# Patient Record
Sex: Female | Born: 1945 | Race: Black or African American | Hispanic: No | Smoking: Former smoker
Health system: Southern US, Community
[De-identification: ages and names within clinical notes are randomized; demographics above are authoritative.]

## PROBLEM LIST (undated history)

## (undated) DIAGNOSIS — K219 Gastro-esophageal reflux disease without esophagitis: Secondary | ICD-10-CM

## (undated) DIAGNOSIS — I4891 Unspecified atrial fibrillation: Secondary | ICD-10-CM

## (undated) DIAGNOSIS — R1310 Dysphagia, unspecified: Secondary | ICD-10-CM

## (undated) DIAGNOSIS — J45909 Unspecified asthma, uncomplicated: Secondary | ICD-10-CM

## (undated) DIAGNOSIS — D649 Anemia, unspecified: Secondary | ICD-10-CM

## (undated) DIAGNOSIS — I639 Cerebral infarction, unspecified: Secondary | ICD-10-CM

## (undated) DIAGNOSIS — I10 Essential (primary) hypertension: Secondary | ICD-10-CM

## (undated) DIAGNOSIS — E119 Type 2 diabetes mellitus without complications: Secondary | ICD-10-CM

## (undated) DIAGNOSIS — N189 Chronic kidney disease, unspecified: Secondary | ICD-10-CM

## (undated) HISTORY — PX: GASTROSTOMY TUBE CHANGE: SHX312

## (undated) HISTORY — PX: JOINT REPLACEMENT: SHX530

## (undated) HISTORY — PX: COLONOSCOPY: SHX174

## (undated) HISTORY — PX: EYE SURGERY: SHX253

---

## 2015-09-11 DIAGNOSIS — E119 Type 2 diabetes mellitus without complications: Secondary | ICD-10-CM | POA: Diagnosis not present

## 2015-09-11 DIAGNOSIS — I10 Essential (primary) hypertension: Secondary | ICD-10-CM | POA: Diagnosis not present

## 2015-09-12 DIAGNOSIS — E1165 Type 2 diabetes mellitus with hyperglycemia: Secondary | ICD-10-CM | POA: Diagnosis not present

## 2015-09-12 DIAGNOSIS — N179 Acute kidney failure, unspecified: Secondary | ICD-10-CM | POA: Diagnosis not present

## 2015-09-12 DIAGNOSIS — E860 Dehydration: Secondary | ICD-10-CM | POA: Diagnosis not present

## 2015-09-12 DIAGNOSIS — Z0389 Encounter for observation for other suspected diseases and conditions ruled out: Secondary | ICD-10-CM | POA: Diagnosis not present

## 2015-09-12 DIAGNOSIS — I10 Essential (primary) hypertension: Secondary | ICD-10-CM | POA: Diagnosis not present

## 2015-09-18 DIAGNOSIS — I10 Essential (primary) hypertension: Secondary | ICD-10-CM | POA: Diagnosis not present

## 2015-09-25 DIAGNOSIS — I10 Essential (primary) hypertension: Secondary | ICD-10-CM | POA: Diagnosis not present

## 2015-12-11 DIAGNOSIS — I639 Cerebral infarction, unspecified: Secondary | ICD-10-CM

## 2015-12-11 HISTORY — DX: Cerebral infarction, unspecified: I63.9

## 2015-12-30 DIAGNOSIS — I63532 Cerebral infarction due to unspecified occlusion or stenosis of left posterior cerebral artery: Secondary | ICD-10-CM | POA: Diagnosis not present

## 2015-12-30 DIAGNOSIS — R4189 Other symptoms and signs involving cognitive functions and awareness: Secondary | ICD-10-CM | POA: Diagnosis present

## 2015-12-30 DIAGNOSIS — K219 Gastro-esophageal reflux disease without esophagitis: Secondary | ICD-10-CM | POA: Diagnosis not present

## 2015-12-30 DIAGNOSIS — I639 Cerebral infarction, unspecified: Secondary | ICD-10-CM | POA: Diagnosis not present

## 2015-12-30 DIAGNOSIS — E871 Hypo-osmolality and hyponatremia: Secondary | ICD-10-CM | POA: Diagnosis not present

## 2015-12-30 DIAGNOSIS — I1310 Hypertensive heart and chronic kidney disease without heart failure, with stage 1 through stage 4 chronic kidney disease, or unspecified chronic kidney disease: Secondary | ICD-10-CM | POA: Diagnosis present

## 2015-12-30 DIAGNOSIS — Z4659 Encounter for fitting and adjustment of other gastrointestinal appliance and device: Secondary | ICD-10-CM | POA: Diagnosis not present

## 2015-12-30 DIAGNOSIS — R2689 Other abnormalities of gait and mobility: Secondary | ICD-10-CM | POA: Diagnosis not present

## 2015-12-30 DIAGNOSIS — Z743 Need for continuous supervision: Secondary | ICD-10-CM | POA: Diagnosis not present

## 2015-12-30 DIAGNOSIS — Z431 Encounter for attention to gastrostomy: Secondary | ICD-10-CM | POA: Diagnosis not present

## 2015-12-30 DIAGNOSIS — E1121 Type 2 diabetes mellitus with diabetic nephropathy: Secondary | ICD-10-CM | POA: Diagnosis not present

## 2015-12-30 DIAGNOSIS — R262 Difficulty in walking, not elsewhere classified: Secondary | ICD-10-CM | POA: Diagnosis not present

## 2015-12-30 DIAGNOSIS — E559 Vitamin D deficiency, unspecified: Secondary | ICD-10-CM | POA: Diagnosis present

## 2015-12-30 DIAGNOSIS — R627 Adult failure to thrive: Secondary | ICD-10-CM | POA: Diagnosis not present

## 2015-12-30 DIAGNOSIS — N179 Acute kidney failure, unspecified: Secondary | ICD-10-CM | POA: Diagnosis not present

## 2015-12-30 DIAGNOSIS — D649 Anemia, unspecified: Secondary | ICD-10-CM | POA: Diagnosis not present

## 2015-12-30 DIAGNOSIS — A419 Sepsis, unspecified organism: Secondary | ICD-10-CM | POA: Diagnosis not present

## 2015-12-30 DIAGNOSIS — E119 Type 2 diabetes mellitus without complications: Secondary | ICD-10-CM | POA: Diagnosis not present

## 2015-12-30 DIAGNOSIS — R000 Tachycardia, unspecified: Secondary | ICD-10-CM | POA: Diagnosis not present

## 2015-12-30 DIAGNOSIS — F09 Unspecified mental disorder due to known physiological condition: Secondary | ICD-10-CM | POA: Diagnosis not present

## 2015-12-30 DIAGNOSIS — R1310 Dysphagia, unspecified: Secondary | ICD-10-CM | POA: Diagnosis not present

## 2015-12-30 DIAGNOSIS — E1122 Type 2 diabetes mellitus with diabetic chronic kidney disease: Secondary | ICD-10-CM | POA: Diagnosis not present

## 2015-12-30 DIAGNOSIS — I517 Cardiomegaly: Secondary | ICD-10-CM | POA: Diagnosis not present

## 2015-12-30 DIAGNOSIS — I10 Essential (primary) hypertension: Secondary | ICD-10-CM | POA: Diagnosis not present

## 2015-12-30 DIAGNOSIS — R402211 Coma scale, best verbal response, none, in the field [EMT or ambulance]: Secondary | ICD-10-CM | POA: Diagnosis present

## 2015-12-30 DIAGNOSIS — Z4682 Encounter for fitting and adjustment of non-vascular catheter: Secondary | ICD-10-CM | POA: Diagnosis not present

## 2015-12-30 DIAGNOSIS — R451 Restlessness and agitation: Secondary | ICD-10-CM | POA: Diagnosis present

## 2015-12-30 DIAGNOSIS — D72829 Elevated white blood cell count, unspecified: Secondary | ICD-10-CM | POA: Diagnosis not present

## 2015-12-30 DIAGNOSIS — N184 Chronic kidney disease, stage 4 (severe): Secondary | ICD-10-CM | POA: Diagnosis not present

## 2015-12-30 DIAGNOSIS — R402141 Coma scale, eyes open, spontaneous, in the field [EMT or ambulance]: Secondary | ICD-10-CM | POA: Diagnosis present

## 2015-12-30 DIAGNOSIS — I272 Pulmonary hypertension, unspecified: Secondary | ICD-10-CM | POA: Diagnosis present

## 2015-12-30 DIAGNOSIS — E1159 Type 2 diabetes mellitus with other circulatory complications: Secondary | ICD-10-CM | POA: Diagnosis not present

## 2015-12-30 DIAGNOSIS — I248 Other forms of acute ischemic heart disease: Secondary | ICD-10-CM | POA: Diagnosis present

## 2015-12-30 DIAGNOSIS — R4701 Aphasia: Secondary | ICD-10-CM | POA: Diagnosis not present

## 2015-12-30 DIAGNOSIS — R4182 Altered mental status, unspecified: Secondary | ICD-10-CM | POA: Diagnosis not present

## 2015-12-30 DIAGNOSIS — R1312 Dysphagia, oropharyngeal phase: Secondary | ICD-10-CM | POA: Diagnosis not present

## 2015-12-30 DIAGNOSIS — T17320A Food in larynx causing asphyxiation, initial encounter: Secondary | ICD-10-CM | POA: Diagnosis not present

## 2015-12-30 DIAGNOSIS — Z781 Physical restraint status: Secondary | ICD-10-CM | POA: Diagnosis not present

## 2015-12-30 DIAGNOSIS — E870 Hyperosmolality and hypernatremia: Secondary | ICD-10-CM | POA: Diagnosis present

## 2015-12-30 DIAGNOSIS — Z9049 Acquired absence of other specified parts of digestive tract: Secondary | ICD-10-CM | POA: Diagnosis not present

## 2015-12-30 DIAGNOSIS — E872 Acidosis: Secondary | ICD-10-CM | POA: Diagnosis not present

## 2015-12-30 DIAGNOSIS — D638 Anemia in other chronic diseases classified elsewhere: Secondary | ICD-10-CM | POA: Diagnosis present

## 2015-12-30 DIAGNOSIS — R402361 Coma scale, best motor response, obeys commands, in the field [EMT or ambulance]: Secondary | ICD-10-CM | POA: Diagnosis present

## 2015-12-30 DIAGNOSIS — I4891 Unspecified atrial fibrillation: Secondary | ICD-10-CM | POA: Diagnosis not present

## 2015-12-30 DIAGNOSIS — E785 Hyperlipidemia, unspecified: Secondary | ICD-10-CM | POA: Diagnosis present

## 2015-12-30 DIAGNOSIS — I4510 Unspecified right bundle-branch block: Secondary | ICD-10-CM | POA: Diagnosis present

## 2015-12-30 DIAGNOSIS — M6281 Muscle weakness (generalized): Secondary | ICD-10-CM | POA: Diagnosis not present

## 2015-12-30 DIAGNOSIS — I619 Nontraumatic intracerebral hemorrhage, unspecified: Secondary | ICD-10-CM | POA: Diagnosis not present

## 2015-12-30 DIAGNOSIS — I499 Cardiac arrhythmia, unspecified: Secondary | ICD-10-CM | POA: Diagnosis not present

## 2015-12-30 DIAGNOSIS — I63432 Cerebral infarction due to embolism of left posterior cerebral artery: Secondary | ICD-10-CM | POA: Diagnosis present

## 2015-12-30 DIAGNOSIS — I6789 Other cerebrovascular disease: Secondary | ICD-10-CM | POA: Diagnosis not present

## 2015-12-30 DIAGNOSIS — N189 Chronic kidney disease, unspecified: Secondary | ICD-10-CM | POA: Diagnosis not present

## 2015-12-30 DIAGNOSIS — Z8679 Personal history of other diseases of the circulatory system: Secondary | ICD-10-CM | POA: Diagnosis not present

## 2015-12-30 DIAGNOSIS — R509 Fever, unspecified: Secondary | ICD-10-CM | POA: Diagnosis not present

## 2015-12-30 DIAGNOSIS — G9340 Encephalopathy, unspecified: Secondary | ICD-10-CM | POA: Diagnosis present

## 2015-12-30 DIAGNOSIS — R001 Bradycardia, unspecified: Secondary | ICD-10-CM | POA: Diagnosis not present

## 2015-12-30 DIAGNOSIS — J9690 Respiratory failure, unspecified, unspecified whether with hypoxia or hypercapnia: Secondary | ICD-10-CM | POA: Diagnosis not present

## 2015-12-30 DIAGNOSIS — G8191 Hemiplegia, unspecified affecting right dominant side: Secondary | ICD-10-CM | POA: Diagnosis present

## 2015-12-30 DIAGNOSIS — I69991 Dysphagia following unspecified cerebrovascular disease: Secondary | ICD-10-CM | POA: Diagnosis not present

## 2015-12-30 DIAGNOSIS — R0989 Other specified symptoms and signs involving the circulatory and respiratory systems: Secondary | ICD-10-CM | POA: Diagnosis not present

## 2015-12-30 DIAGNOSIS — R109 Unspecified abdominal pain: Secondary | ICD-10-CM | POA: Diagnosis not present

## 2015-12-30 DIAGNOSIS — J811 Chronic pulmonary edema: Secondary | ICD-10-CM | POA: Diagnosis not present

## 2016-01-12 DIAGNOSIS — I69991 Dysphagia following unspecified cerebrovascular disease: Secondary | ICD-10-CM | POA: Diagnosis not present

## 2016-01-23 DIAGNOSIS — K219 Gastro-esophageal reflux disease without esophagitis: Secondary | ICD-10-CM | POA: Diagnosis not present

## 2016-01-23 DIAGNOSIS — R262 Difficulty in walking, not elsewhere classified: Secondary | ICD-10-CM | POA: Diagnosis not present

## 2016-01-23 DIAGNOSIS — N189 Chronic kidney disease, unspecified: Secondary | ICD-10-CM | POA: Diagnosis not present

## 2016-01-23 DIAGNOSIS — D649 Anemia, unspecified: Secondary | ICD-10-CM | POA: Diagnosis not present

## 2016-01-23 DIAGNOSIS — Z431 Encounter for attention to gastrostomy: Secondary | ICD-10-CM | POA: Diagnosis not present

## 2016-01-23 DIAGNOSIS — N179 Acute kidney failure, unspecified: Secondary | ICD-10-CM | POA: Diagnosis not present

## 2016-01-23 DIAGNOSIS — I739 Peripheral vascular disease, unspecified: Secondary | ICD-10-CM | POA: Diagnosis not present

## 2016-01-23 DIAGNOSIS — I63532 Cerebral infarction due to unspecified occlusion or stenosis of left posterior cerebral artery: Secondary | ICD-10-CM | POA: Diagnosis not present

## 2016-01-23 DIAGNOSIS — E1140 Type 2 diabetes mellitus with diabetic neuropathy, unspecified: Secondary | ICD-10-CM | POA: Diagnosis not present

## 2016-01-23 DIAGNOSIS — R1310 Dysphagia, unspecified: Secondary | ICD-10-CM | POA: Diagnosis not present

## 2016-01-23 DIAGNOSIS — Z743 Need for continuous supervision: Secondary | ICD-10-CM | POA: Diagnosis not present

## 2016-01-23 DIAGNOSIS — R001 Bradycardia, unspecified: Secondary | ICD-10-CM | POA: Diagnosis not present

## 2016-01-23 DIAGNOSIS — M6281 Muscle weakness (generalized): Secondary | ICD-10-CM | POA: Diagnosis not present

## 2016-01-23 DIAGNOSIS — L84 Corns and callosities: Secondary | ICD-10-CM | POA: Diagnosis not present

## 2016-01-23 DIAGNOSIS — E559 Vitamin D deficiency, unspecified: Secondary | ICD-10-CM | POA: Diagnosis not present

## 2016-01-23 DIAGNOSIS — I4891 Unspecified atrial fibrillation: Secondary | ICD-10-CM | POA: Diagnosis not present

## 2016-01-23 DIAGNOSIS — I63422 Cerebral infarction due to embolism of left anterior cerebral artery: Secondary | ICD-10-CM | POA: Diagnosis not present

## 2016-01-23 DIAGNOSIS — R509 Fever, unspecified: Secondary | ICD-10-CM | POA: Diagnosis not present

## 2016-01-23 DIAGNOSIS — E119 Type 2 diabetes mellitus without complications: Secondary | ICD-10-CM | POA: Diagnosis not present

## 2016-01-23 DIAGNOSIS — R1312 Dysphagia, oropharyngeal phase: Secondary | ICD-10-CM | POA: Diagnosis not present

## 2016-01-23 DIAGNOSIS — I10 Essential (primary) hypertension: Secondary | ICD-10-CM | POA: Diagnosis not present

## 2016-01-23 DIAGNOSIS — E785 Hyperlipidemia, unspecified: Secondary | ICD-10-CM | POA: Diagnosis not present

## 2016-01-23 DIAGNOSIS — I6789 Other cerebrovascular disease: Secondary | ICD-10-CM | POA: Diagnosis not present

## 2016-01-23 DIAGNOSIS — B351 Tinea unguium: Secondary | ICD-10-CM | POA: Diagnosis not present

## 2016-01-23 DIAGNOSIS — I639 Cerebral infarction, unspecified: Secondary | ICD-10-CM | POA: Diagnosis not present

## 2016-01-23 DIAGNOSIS — F09 Unspecified mental disorder due to known physiological condition: Secondary | ICD-10-CM | POA: Diagnosis not present

## 2016-01-23 DIAGNOSIS — R2689 Other abnormalities of gait and mobility: Secondary | ICD-10-CM | POA: Diagnosis not present

## 2016-01-26 DIAGNOSIS — I63422 Cerebral infarction due to embolism of left anterior cerebral artery: Secondary | ICD-10-CM | POA: Diagnosis not present

## 2016-01-26 DIAGNOSIS — R1310 Dysphagia, unspecified: Secondary | ICD-10-CM | POA: Diagnosis not present

## 2016-01-26 DIAGNOSIS — I10 Essential (primary) hypertension: Secondary | ICD-10-CM | POA: Diagnosis not present

## 2016-02-11 DIAGNOSIS — E559 Vitamin D deficiency, unspecified: Secondary | ICD-10-CM | POA: Diagnosis not present

## 2016-02-11 DIAGNOSIS — I10 Essential (primary) hypertension: Secondary | ICD-10-CM | POA: Diagnosis not present

## 2016-02-11 DIAGNOSIS — I63422 Cerebral infarction due to embolism of left anterior cerebral artery: Secondary | ICD-10-CM | POA: Diagnosis not present

## 2016-02-11 DIAGNOSIS — R1310 Dysphagia, unspecified: Secondary | ICD-10-CM | POA: Diagnosis not present

## 2016-02-12 DIAGNOSIS — E1140 Type 2 diabetes mellitus with diabetic neuropathy, unspecified: Secondary | ICD-10-CM | POA: Diagnosis not present

## 2016-02-12 DIAGNOSIS — L84 Corns and callosities: Secondary | ICD-10-CM | POA: Diagnosis not present

## 2016-02-12 DIAGNOSIS — B351 Tinea unguium: Secondary | ICD-10-CM | POA: Diagnosis not present

## 2016-02-12 DIAGNOSIS — I739 Peripheral vascular disease, unspecified: Secondary | ICD-10-CM | POA: Diagnosis not present

## 2016-03-01 DIAGNOSIS — I4891 Unspecified atrial fibrillation: Secondary | ICD-10-CM | POA: Diagnosis not present

## 2016-03-01 DIAGNOSIS — I10 Essential (primary) hypertension: Secondary | ICD-10-CM | POA: Diagnosis not present

## 2016-03-01 DIAGNOSIS — I639 Cerebral infarction, unspecified: Secondary | ICD-10-CM | POA: Diagnosis not present

## 2016-03-01 DIAGNOSIS — E119 Type 2 diabetes mellitus without complications: Secondary | ICD-10-CM | POA: Diagnosis not present

## 2016-03-15 DIAGNOSIS — E559 Vitamin D deficiency, unspecified: Secondary | ICD-10-CM | POA: Diagnosis not present

## 2016-03-15 DIAGNOSIS — I63422 Cerebral infarction due to embolism of left anterior cerebral artery: Secondary | ICD-10-CM | POA: Diagnosis not present

## 2016-03-15 DIAGNOSIS — I10 Essential (primary) hypertension: Secondary | ICD-10-CM | POA: Diagnosis not present

## 2016-03-15 DIAGNOSIS — R1310 Dysphagia, unspecified: Secondary | ICD-10-CM | POA: Diagnosis not present

## 2016-03-17 DIAGNOSIS — I639 Cerebral infarction, unspecified: Secondary | ICD-10-CM | POA: Diagnosis not present

## 2016-03-17 DIAGNOSIS — E785 Hyperlipidemia, unspecified: Secondary | ICD-10-CM | POA: Insufficient documentation

## 2016-03-17 DIAGNOSIS — Z8673 Personal history of transient ischemic attack (TIA), and cerebral infarction without residual deficits: Secondary | ICD-10-CM | POA: Diagnosis not present

## 2016-03-17 DIAGNOSIS — D649 Anemia, unspecified: Secondary | ICD-10-CM | POA: Insufficient documentation

## 2016-03-17 DIAGNOSIS — R1312 Dysphagia, oropharyngeal phase: Secondary | ICD-10-CM | POA: Diagnosis not present

## 2016-03-17 DIAGNOSIS — E119 Type 2 diabetes mellitus without complications: Secondary | ICD-10-CM | POA: Insufficient documentation

## 2016-03-17 DIAGNOSIS — I10 Essential (primary) hypertension: Secondary | ICD-10-CM | POA: Insufficient documentation

## 2016-03-17 DIAGNOSIS — N189 Chronic kidney disease, unspecified: Secondary | ICD-10-CM | POA: Diagnosis not present

## 2016-03-17 DIAGNOSIS — I129 Hypertensive chronic kidney disease with stage 1 through stage 4 chronic kidney disease, or unspecified chronic kidney disease: Secondary | ICD-10-CM | POA: Diagnosis not present

## 2016-03-17 DIAGNOSIS — I4891 Unspecified atrial fibrillation: Secondary | ICD-10-CM | POA: Insufficient documentation

## 2016-03-17 DIAGNOSIS — Z7901 Long term (current) use of anticoagulants: Secondary | ICD-10-CM | POA: Diagnosis not present

## 2016-03-17 DIAGNOSIS — K219 Gastro-esophageal reflux disease without esophagitis: Secondary | ICD-10-CM | POA: Insufficient documentation

## 2016-03-17 DIAGNOSIS — M6281 Muscle weakness (generalized): Secondary | ICD-10-CM | POA: Insufficient documentation

## 2016-03-18 DIAGNOSIS — R1312 Dysphagia, oropharyngeal phase: Secondary | ICD-10-CM | POA: Diagnosis not present

## 2016-03-18 DIAGNOSIS — E1122 Type 2 diabetes mellitus with diabetic chronic kidney disease: Secondary | ICD-10-CM | POA: Diagnosis not present

## 2016-03-18 DIAGNOSIS — E1165 Type 2 diabetes mellitus with hyperglycemia: Secondary | ICD-10-CM | POA: Diagnosis not present

## 2016-03-18 DIAGNOSIS — B353 Tinea pedis: Secondary | ICD-10-CM | POA: Diagnosis not present

## 2016-03-18 DIAGNOSIS — D649 Anemia, unspecified: Secondary | ICD-10-CM | POA: Diagnosis not present

## 2016-03-18 DIAGNOSIS — E785 Hyperlipidemia, unspecified: Secondary | ICD-10-CM | POA: Diagnosis not present

## 2016-03-18 DIAGNOSIS — I4891 Unspecified atrial fibrillation: Secondary | ICD-10-CM | POA: Diagnosis not present

## 2016-03-18 DIAGNOSIS — I10 Essential (primary) hypertension: Secondary | ICD-10-CM | POA: Diagnosis not present

## 2016-03-18 DIAGNOSIS — N189 Chronic kidney disease, unspecified: Secondary | ICD-10-CM | POA: Diagnosis not present

## 2016-03-18 DIAGNOSIS — I639 Cerebral infarction, unspecified: Secondary | ICD-10-CM | POA: Diagnosis not present

## 2016-03-21 DIAGNOSIS — Z8673 Personal history of transient ischemic attack (TIA), and cerebral infarction without residual deficits: Secondary | ICD-10-CM | POA: Diagnosis not present

## 2016-03-21 DIAGNOSIS — E785 Hyperlipidemia, unspecified: Secondary | ICD-10-CM | POA: Diagnosis not present

## 2016-03-21 DIAGNOSIS — N189 Chronic kidney disease, unspecified: Secondary | ICD-10-CM | POA: Diagnosis not present

## 2016-03-21 DIAGNOSIS — I4891 Unspecified atrial fibrillation: Secondary | ICD-10-CM | POA: Diagnosis not present

## 2016-03-21 DIAGNOSIS — E119 Type 2 diabetes mellitus without complications: Secondary | ICD-10-CM | POA: Diagnosis not present

## 2016-03-21 DIAGNOSIS — I129 Hypertensive chronic kidney disease with stage 1 through stage 4 chronic kidney disease, or unspecified chronic kidney disease: Secondary | ICD-10-CM | POA: Diagnosis not present

## 2016-03-21 DIAGNOSIS — I10 Essential (primary) hypertension: Secondary | ICD-10-CM | POA: Diagnosis not present

## 2016-03-22 DIAGNOSIS — I4891 Unspecified atrial fibrillation: Secondary | ICD-10-CM | POA: Diagnosis not present

## 2016-03-22 DIAGNOSIS — Z8673 Personal history of transient ischemic attack (TIA), and cerebral infarction without residual deficits: Secondary | ICD-10-CM | POA: Diagnosis not present

## 2016-03-22 DIAGNOSIS — I129 Hypertensive chronic kidney disease with stage 1 through stage 4 chronic kidney disease, or unspecified chronic kidney disease: Secondary | ICD-10-CM | POA: Diagnosis not present

## 2016-03-22 DIAGNOSIS — E119 Type 2 diabetes mellitus without complications: Secondary | ICD-10-CM | POA: Diagnosis not present

## 2016-03-22 DIAGNOSIS — E785 Hyperlipidemia, unspecified: Secondary | ICD-10-CM | POA: Diagnosis not present

## 2016-03-22 DIAGNOSIS — N189 Chronic kidney disease, unspecified: Secondary | ICD-10-CM | POA: Diagnosis not present

## 2016-03-23 DIAGNOSIS — Z8673 Personal history of transient ischemic attack (TIA), and cerebral infarction without residual deficits: Secondary | ICD-10-CM | POA: Diagnosis not present

## 2016-03-23 DIAGNOSIS — N189 Chronic kidney disease, unspecified: Secondary | ICD-10-CM | POA: Diagnosis not present

## 2016-03-23 DIAGNOSIS — E785 Hyperlipidemia, unspecified: Secondary | ICD-10-CM | POA: Diagnosis not present

## 2016-03-23 DIAGNOSIS — I129 Hypertensive chronic kidney disease with stage 1 through stage 4 chronic kidney disease, or unspecified chronic kidney disease: Secondary | ICD-10-CM | POA: Diagnosis not present

## 2016-03-23 DIAGNOSIS — I4891 Unspecified atrial fibrillation: Secondary | ICD-10-CM | POA: Diagnosis not present

## 2016-03-23 DIAGNOSIS — E119 Type 2 diabetes mellitus without complications: Secondary | ICD-10-CM | POA: Diagnosis not present

## 2016-03-24 DIAGNOSIS — I4891 Unspecified atrial fibrillation: Secondary | ICD-10-CM | POA: Diagnosis not present

## 2016-03-24 DIAGNOSIS — I129 Hypertensive chronic kidney disease with stage 1 through stage 4 chronic kidney disease, or unspecified chronic kidney disease: Secondary | ICD-10-CM | POA: Diagnosis not present

## 2016-03-24 DIAGNOSIS — N189 Chronic kidney disease, unspecified: Secondary | ICD-10-CM | POA: Diagnosis not present

## 2016-03-24 DIAGNOSIS — E785 Hyperlipidemia, unspecified: Secondary | ICD-10-CM | POA: Diagnosis not present

## 2016-03-24 DIAGNOSIS — E119 Type 2 diabetes mellitus without complications: Secondary | ICD-10-CM | POA: Diagnosis not present

## 2016-03-24 DIAGNOSIS — Z8673 Personal history of transient ischemic attack (TIA), and cerebral infarction without residual deficits: Secondary | ICD-10-CM | POA: Diagnosis not present

## 2016-03-25 DIAGNOSIS — I4891 Unspecified atrial fibrillation: Secondary | ICD-10-CM | POA: Diagnosis not present

## 2016-03-25 DIAGNOSIS — E119 Type 2 diabetes mellitus without complications: Secondary | ICD-10-CM | POA: Diagnosis not present

## 2016-03-25 DIAGNOSIS — I129 Hypertensive chronic kidney disease with stage 1 through stage 4 chronic kidney disease, or unspecified chronic kidney disease: Secondary | ICD-10-CM | POA: Diagnosis not present

## 2016-03-25 DIAGNOSIS — N189 Chronic kidney disease, unspecified: Secondary | ICD-10-CM | POA: Diagnosis not present

## 2016-03-25 DIAGNOSIS — Z8673 Personal history of transient ischemic attack (TIA), and cerebral infarction without residual deficits: Secondary | ICD-10-CM | POA: Diagnosis not present

## 2016-03-25 DIAGNOSIS — E785 Hyperlipidemia, unspecified: Secondary | ICD-10-CM | POA: Diagnosis not present

## 2016-03-28 DIAGNOSIS — Z8673 Personal history of transient ischemic attack (TIA), and cerebral infarction without residual deficits: Secondary | ICD-10-CM | POA: Diagnosis not present

## 2016-03-28 DIAGNOSIS — E785 Hyperlipidemia, unspecified: Secondary | ICD-10-CM | POA: Diagnosis not present

## 2016-03-28 DIAGNOSIS — N189 Chronic kidney disease, unspecified: Secondary | ICD-10-CM | POA: Diagnosis not present

## 2016-03-28 DIAGNOSIS — I4891 Unspecified atrial fibrillation: Secondary | ICD-10-CM | POA: Diagnosis not present

## 2016-03-28 DIAGNOSIS — E119 Type 2 diabetes mellitus without complications: Secondary | ICD-10-CM | POA: Diagnosis not present

## 2016-03-28 DIAGNOSIS — I129 Hypertensive chronic kidney disease with stage 1 through stage 4 chronic kidney disease, or unspecified chronic kidney disease: Secondary | ICD-10-CM | POA: Diagnosis not present

## 2016-03-29 DIAGNOSIS — E785 Hyperlipidemia, unspecified: Secondary | ICD-10-CM | POA: Diagnosis not present

## 2016-03-29 DIAGNOSIS — I129 Hypertensive chronic kidney disease with stage 1 through stage 4 chronic kidney disease, or unspecified chronic kidney disease: Secondary | ICD-10-CM | POA: Diagnosis not present

## 2016-03-29 DIAGNOSIS — N189 Chronic kidney disease, unspecified: Secondary | ICD-10-CM | POA: Diagnosis not present

## 2016-03-29 DIAGNOSIS — Z8673 Personal history of transient ischemic attack (TIA), and cerebral infarction without residual deficits: Secondary | ICD-10-CM | POA: Diagnosis not present

## 2016-03-29 DIAGNOSIS — E119 Type 2 diabetes mellitus without complications: Secondary | ICD-10-CM | POA: Diagnosis not present

## 2016-03-29 DIAGNOSIS — I4891 Unspecified atrial fibrillation: Secondary | ICD-10-CM | POA: Diagnosis not present

## 2016-03-30 DIAGNOSIS — I129 Hypertensive chronic kidney disease with stage 1 through stage 4 chronic kidney disease, or unspecified chronic kidney disease: Secondary | ICD-10-CM | POA: Diagnosis not present

## 2016-03-30 DIAGNOSIS — N189 Chronic kidney disease, unspecified: Secondary | ICD-10-CM | POA: Diagnosis not present

## 2016-03-30 DIAGNOSIS — Z8673 Personal history of transient ischemic attack (TIA), and cerebral infarction without residual deficits: Secondary | ICD-10-CM | POA: Diagnosis not present

## 2016-03-30 DIAGNOSIS — I4891 Unspecified atrial fibrillation: Secondary | ICD-10-CM | POA: Diagnosis not present

## 2016-03-30 DIAGNOSIS — E119 Type 2 diabetes mellitus without complications: Secondary | ICD-10-CM | POA: Diagnosis not present

## 2016-03-30 DIAGNOSIS — E785 Hyperlipidemia, unspecified: Secondary | ICD-10-CM | POA: Diagnosis not present

## 2016-03-31 DIAGNOSIS — N189 Chronic kidney disease, unspecified: Secondary | ICD-10-CM | POA: Diagnosis not present

## 2016-03-31 DIAGNOSIS — E785 Hyperlipidemia, unspecified: Secondary | ICD-10-CM | POA: Diagnosis not present

## 2016-03-31 DIAGNOSIS — E119 Type 2 diabetes mellitus without complications: Secondary | ICD-10-CM | POA: Diagnosis not present

## 2016-03-31 DIAGNOSIS — I4891 Unspecified atrial fibrillation: Secondary | ICD-10-CM | POA: Diagnosis not present

## 2016-03-31 DIAGNOSIS — I129 Hypertensive chronic kidney disease with stage 1 through stage 4 chronic kidney disease, or unspecified chronic kidney disease: Secondary | ICD-10-CM | POA: Diagnosis not present

## 2016-03-31 DIAGNOSIS — Z8673 Personal history of transient ischemic attack (TIA), and cerebral infarction without residual deficits: Secondary | ICD-10-CM | POA: Diagnosis not present

## 2016-04-01 DIAGNOSIS — I639 Cerebral infarction, unspecified: Secondary | ICD-10-CM | POA: Diagnosis not present

## 2016-04-01 DIAGNOSIS — R202 Paresthesia of skin: Secondary | ICD-10-CM | POA: Diagnosis not present

## 2016-04-01 DIAGNOSIS — N189 Chronic kidney disease, unspecified: Secondary | ICD-10-CM | POA: Diagnosis not present

## 2016-04-01 DIAGNOSIS — I4891 Unspecified atrial fibrillation: Secondary | ICD-10-CM | POA: Diagnosis not present

## 2016-04-01 DIAGNOSIS — R791 Abnormal coagulation profile: Secondary | ICD-10-CM | POA: Diagnosis not present

## 2016-04-01 DIAGNOSIS — H269 Unspecified cataract: Secondary | ICD-10-CM | POA: Diagnosis not present

## 2016-04-01 DIAGNOSIS — E1165 Type 2 diabetes mellitus with hyperglycemia: Secondary | ICD-10-CM | POA: Diagnosis not present

## 2016-04-01 DIAGNOSIS — D509 Iron deficiency anemia, unspecified: Secondary | ICD-10-CM | POA: Diagnosis not present

## 2016-04-01 DIAGNOSIS — H53143 Visual discomfort, bilateral: Secondary | ICD-10-CM | POA: Diagnosis not present

## 2016-04-01 DIAGNOSIS — E1142 Type 2 diabetes mellitus with diabetic polyneuropathy: Secondary | ICD-10-CM | POA: Diagnosis not present

## 2016-04-06 ENCOUNTER — Encounter: Payer: Self-pay | Admitting: Podiatry

## 2016-04-06 ENCOUNTER — Ambulatory Visit (INDEPENDENT_AMBULATORY_CARE_PROVIDER_SITE_OTHER): Payer: Medicare Other | Admitting: Podiatry

## 2016-04-06 VITALS — BP 143/62 | HR 57 | Resp 18

## 2016-04-06 DIAGNOSIS — R0989 Other specified symptoms and signs involving the circulatory and respiratory systems: Secondary | ICD-10-CM

## 2016-04-06 DIAGNOSIS — E119 Type 2 diabetes mellitus without complications: Secondary | ICD-10-CM | POA: Diagnosis not present

## 2016-04-06 DIAGNOSIS — N189 Chronic kidney disease, unspecified: Secondary | ICD-10-CM | POA: Diagnosis not present

## 2016-04-06 DIAGNOSIS — E785 Hyperlipidemia, unspecified: Secondary | ICD-10-CM | POA: Diagnosis not present

## 2016-04-06 DIAGNOSIS — I4891 Unspecified atrial fibrillation: Secondary | ICD-10-CM | POA: Diagnosis not present

## 2016-04-06 DIAGNOSIS — I129 Hypertensive chronic kidney disease with stage 1 through stage 4 chronic kidney disease, or unspecified chronic kidney disease: Secondary | ICD-10-CM | POA: Diagnosis not present

## 2016-04-06 DIAGNOSIS — B351 Tinea unguium: Secondary | ICD-10-CM

## 2016-04-06 DIAGNOSIS — Z8673 Personal history of transient ischemic attack (TIA), and cerebral infarction without residual deficits: Secondary | ICD-10-CM | POA: Diagnosis not present

## 2016-04-06 NOTE — Patient Instructions (Signed)

## 2016-04-06 NOTE — Progress Notes (Signed)
   Subjective:    Patient ID: Tracy Elliott, female    DOB: Dec 26, 1945, 70 y.o.   MRN: [id number]  HPI     This patient presents today with her niece present in the treatment room. The patient is complaining that her toenails are long and thick and are uncomfortable when she walks and wearing shoes and she requesting trimming of these toenails. She says that she has visited a podiatrist when she was living in [place] approximately year ago and had nail debridement at that time. Patient denies any recent podiatric care  Patient is a diabetic and denies any history of amputation, claudication or amputation She is a former smoker, however has not smoked for many years    Review of Systems  Eyes: Positive for visual disturbance.  Cardiovascular:       2017 stroke/blood pressure  Gastrointestinal:       Kidney disease/g tube-cva  Endocrine:       Diabetic  Neurological:       Memory loss-cva  Hematological:       Anemia  All other systems reviewed and are negative.      Objective:   Physical Exam   Patient appears orientated 3  Vascular: DP pulses 2/4 bilaterally PT pulses 1/bilaterally Capillary reflex within normal limits bilaterally  Neurological: Sensation to 10 g monofilament wire intact 2/5 right 5/5 left Vibratory sensation reactive bilaterally Ankle reflex equal and reactive bilaterally  Dermatological: Open skin lesions bilaterally The toenails are extremely elongated, deformed, discolored, hypertrophic and tender direct palpation 6-10  Musculoskeletal: HAV bilaterally Manual motor testing dorsi flexion, plantar flexion 5/5 bilaterally      Assessment & Plan:   Assessment: Diabetic with mild decrease of protective sensation Decrease pedal pulses bilaterally Neglected symptomatic onychomycoses 6-10  Plan: I discussed the results of exam with patient at patient's niece present treatment room. At this time I recommended periodic toenail debridement.  Patient verbally consents. The toenails 6-10 were debrided mechanically and elected without any bleeding  Reappoint 3 months

## 2016-04-07 DIAGNOSIS — Z8673 Personal history of transient ischemic attack (TIA), and cerebral infarction without residual deficits: Secondary | ICD-10-CM | POA: Diagnosis not present

## 2016-04-07 DIAGNOSIS — N189 Chronic kidney disease, unspecified: Secondary | ICD-10-CM | POA: Diagnosis not present

## 2016-04-07 DIAGNOSIS — I4891 Unspecified atrial fibrillation: Secondary | ICD-10-CM | POA: Diagnosis not present

## 2016-04-07 DIAGNOSIS — I129 Hypertensive chronic kidney disease with stage 1 through stage 4 chronic kidney disease, or unspecified chronic kidney disease: Secondary | ICD-10-CM | POA: Diagnosis not present

## 2016-04-07 DIAGNOSIS — E119 Type 2 diabetes mellitus without complications: Secondary | ICD-10-CM | POA: Diagnosis not present

## 2016-04-07 DIAGNOSIS — E785 Hyperlipidemia, unspecified: Secondary | ICD-10-CM | POA: Diagnosis not present

## 2016-04-08 DIAGNOSIS — I129 Hypertensive chronic kidney disease with stage 1 through stage 4 chronic kidney disease, or unspecified chronic kidney disease: Secondary | ICD-10-CM | POA: Diagnosis not present

## 2016-04-08 DIAGNOSIS — E785 Hyperlipidemia, unspecified: Secondary | ICD-10-CM | POA: Diagnosis not present

## 2016-04-08 DIAGNOSIS — E119 Type 2 diabetes mellitus without complications: Secondary | ICD-10-CM | POA: Diagnosis not present

## 2016-04-08 DIAGNOSIS — N189 Chronic kidney disease, unspecified: Secondary | ICD-10-CM | POA: Diagnosis not present

## 2016-04-08 DIAGNOSIS — Z8673 Personal history of transient ischemic attack (TIA), and cerebral infarction without residual deficits: Secondary | ICD-10-CM | POA: Diagnosis not present

## 2016-04-08 DIAGNOSIS — I4891 Unspecified atrial fibrillation: Secondary | ICD-10-CM | POA: Diagnosis not present

## 2016-04-12 DIAGNOSIS — I129 Hypertensive chronic kidney disease with stage 1 through stage 4 chronic kidney disease, or unspecified chronic kidney disease: Secondary | ICD-10-CM | POA: Diagnosis not present

## 2016-04-12 DIAGNOSIS — E119 Type 2 diabetes mellitus without complications: Secondary | ICD-10-CM | POA: Diagnosis not present

## 2016-04-12 DIAGNOSIS — Z8673 Personal history of transient ischemic attack (TIA), and cerebral infarction without residual deficits: Secondary | ICD-10-CM | POA: Diagnosis not present

## 2016-04-12 DIAGNOSIS — E785 Hyperlipidemia, unspecified: Secondary | ICD-10-CM | POA: Diagnosis not present

## 2016-04-12 DIAGNOSIS — I4891 Unspecified atrial fibrillation: Secondary | ICD-10-CM | POA: Diagnosis not present

## 2016-04-12 DIAGNOSIS — N189 Chronic kidney disease, unspecified: Secondary | ICD-10-CM | POA: Diagnosis not present

## 2016-04-13 DIAGNOSIS — Z8673 Personal history of transient ischemic attack (TIA), and cerebral infarction without residual deficits: Secondary | ICD-10-CM | POA: Diagnosis not present

## 2016-04-13 DIAGNOSIS — N189 Chronic kidney disease, unspecified: Secondary | ICD-10-CM | POA: Diagnosis not present

## 2016-04-13 DIAGNOSIS — I4891 Unspecified atrial fibrillation: Secondary | ICD-10-CM | POA: Diagnosis not present

## 2016-04-13 DIAGNOSIS — E119 Type 2 diabetes mellitus without complications: Secondary | ICD-10-CM | POA: Diagnosis not present

## 2016-04-13 DIAGNOSIS — I129 Hypertensive chronic kidney disease with stage 1 through stage 4 chronic kidney disease, or unspecified chronic kidney disease: Secondary | ICD-10-CM | POA: Diagnosis not present

## 2016-04-13 DIAGNOSIS — E785 Hyperlipidemia, unspecified: Secondary | ICD-10-CM | POA: Diagnosis not present

## 2016-04-14 DIAGNOSIS — N189 Chronic kidney disease, unspecified: Secondary | ICD-10-CM | POA: Diagnosis not present

## 2016-04-14 DIAGNOSIS — E119 Type 2 diabetes mellitus without complications: Secondary | ICD-10-CM | POA: Diagnosis not present

## 2016-04-14 DIAGNOSIS — E785 Hyperlipidemia, unspecified: Secondary | ICD-10-CM | POA: Diagnosis not present

## 2016-04-14 DIAGNOSIS — I129 Hypertensive chronic kidney disease with stage 1 through stage 4 chronic kidney disease, or unspecified chronic kidney disease: Secondary | ICD-10-CM | POA: Diagnosis not present

## 2016-04-14 DIAGNOSIS — Z8673 Personal history of transient ischemic attack (TIA), and cerebral infarction without residual deficits: Secondary | ICD-10-CM | POA: Diagnosis not present

## 2016-04-14 DIAGNOSIS — I4891 Unspecified atrial fibrillation: Secondary | ICD-10-CM | POA: Diagnosis not present

## 2016-04-15 DIAGNOSIS — N189 Chronic kidney disease, unspecified: Secondary | ICD-10-CM | POA: Diagnosis not present

## 2016-04-15 DIAGNOSIS — Z7901 Long term (current) use of anticoagulants: Secondary | ICD-10-CM | POA: Diagnosis not present

## 2016-04-15 DIAGNOSIS — Z8673 Personal history of transient ischemic attack (TIA), and cerebral infarction without residual deficits: Secondary | ICD-10-CM | POA: Diagnosis not present

## 2016-04-15 DIAGNOSIS — D649 Anemia, unspecified: Secondary | ICD-10-CM | POA: Diagnosis not present

## 2016-04-15 DIAGNOSIS — I4891 Unspecified atrial fibrillation: Secondary | ICD-10-CM | POA: Diagnosis not present

## 2016-04-15 DIAGNOSIS — I129 Hypertensive chronic kidney disease with stage 1 through stage 4 chronic kidney disease, or unspecified chronic kidney disease: Secondary | ICD-10-CM | POA: Diagnosis not present

## 2016-04-15 DIAGNOSIS — I10 Essential (primary) hypertension: Secondary | ICD-10-CM | POA: Diagnosis not present

## 2016-04-15 DIAGNOSIS — M6281 Muscle weakness (generalized): Secondary | ICD-10-CM | POA: Diagnosis not present

## 2016-04-15 DIAGNOSIS — E119 Type 2 diabetes mellitus without complications: Secondary | ICD-10-CM | POA: Diagnosis not present

## 2016-04-15 DIAGNOSIS — E1165 Type 2 diabetes mellitus with hyperglycemia: Secondary | ICD-10-CM | POA: Diagnosis not present

## 2016-04-15 DIAGNOSIS — R1312 Dysphagia, oropharyngeal phase: Secondary | ICD-10-CM | POA: Diagnosis not present

## 2016-04-15 DIAGNOSIS — E785 Hyperlipidemia, unspecified: Secondary | ICD-10-CM | POA: Diagnosis not present

## 2016-04-18 DIAGNOSIS — I4891 Unspecified atrial fibrillation: Secondary | ICD-10-CM | POA: Diagnosis not present

## 2016-04-18 DIAGNOSIS — I129 Hypertensive chronic kidney disease with stage 1 through stage 4 chronic kidney disease, or unspecified chronic kidney disease: Secondary | ICD-10-CM | POA: Diagnosis not present

## 2016-04-18 DIAGNOSIS — Z8673 Personal history of transient ischemic attack (TIA), and cerebral infarction without residual deficits: Secondary | ICD-10-CM | POA: Diagnosis not present

## 2016-04-18 DIAGNOSIS — N189 Chronic kidney disease, unspecified: Secondary | ICD-10-CM | POA: Diagnosis not present

## 2016-04-18 DIAGNOSIS — E119 Type 2 diabetes mellitus without complications: Secondary | ICD-10-CM | POA: Diagnosis not present

## 2016-04-18 DIAGNOSIS — E785 Hyperlipidemia, unspecified: Secondary | ICD-10-CM | POA: Diagnosis not present

## 2016-04-19 DIAGNOSIS — E1129 Type 2 diabetes mellitus with other diabetic kidney complication: Secondary | ICD-10-CM | POA: Diagnosis not present

## 2016-04-19 DIAGNOSIS — R195 Other fecal abnormalities: Secondary | ICD-10-CM | POA: Diagnosis not present

## 2016-04-19 DIAGNOSIS — Z8673 Personal history of transient ischemic attack (TIA), and cerebral infarction without residual deficits: Secondary | ICD-10-CM | POA: Diagnosis not present

## 2016-04-19 DIAGNOSIS — E785 Hyperlipidemia, unspecified: Secondary | ICD-10-CM | POA: Diagnosis not present

## 2016-04-19 DIAGNOSIS — D631 Anemia in chronic kidney disease: Secondary | ICD-10-CM | POA: Diagnosis not present

## 2016-04-19 DIAGNOSIS — N189 Chronic kidney disease, unspecified: Secondary | ICD-10-CM | POA: Diagnosis not present

## 2016-04-19 DIAGNOSIS — I129 Hypertensive chronic kidney disease with stage 1 through stage 4 chronic kidney disease, or unspecified chronic kidney disease: Secondary | ICD-10-CM | POA: Diagnosis not present

## 2016-04-19 DIAGNOSIS — D509 Iron deficiency anemia, unspecified: Secondary | ICD-10-CM | POA: Diagnosis not present

## 2016-04-19 DIAGNOSIS — I4891 Unspecified atrial fibrillation: Secondary | ICD-10-CM | POA: Diagnosis not present

## 2016-04-19 DIAGNOSIS — Z6830 Body mass index (BMI) 30.0-30.9, adult: Secondary | ICD-10-CM | POA: Diagnosis not present

## 2016-04-19 DIAGNOSIS — E119 Type 2 diabetes mellitus without complications: Secondary | ICD-10-CM | POA: Diagnosis not present

## 2016-04-19 DIAGNOSIS — N183 Chronic kidney disease, stage 3 (moderate): Secondary | ICD-10-CM | POA: Diagnosis not present

## 2016-04-19 DIAGNOSIS — N390 Urinary tract infection, site not specified: Secondary | ICD-10-CM | POA: Diagnosis not present

## 2016-04-20 ENCOUNTER — Other Ambulatory Visit: Payer: Self-pay | Admitting: Nephrology

## 2016-04-20 DIAGNOSIS — N189 Chronic kidney disease, unspecified: Secondary | ICD-10-CM

## 2016-04-21 DIAGNOSIS — E119 Type 2 diabetes mellitus without complications: Secondary | ICD-10-CM | POA: Diagnosis not present

## 2016-04-21 DIAGNOSIS — Z8673 Personal history of transient ischemic attack (TIA), and cerebral infarction without residual deficits: Secondary | ICD-10-CM | POA: Diagnosis not present

## 2016-04-21 DIAGNOSIS — E785 Hyperlipidemia, unspecified: Secondary | ICD-10-CM | POA: Diagnosis not present

## 2016-04-21 DIAGNOSIS — I129 Hypertensive chronic kidney disease with stage 1 through stage 4 chronic kidney disease, or unspecified chronic kidney disease: Secondary | ICD-10-CM | POA: Diagnosis not present

## 2016-04-21 DIAGNOSIS — N189 Chronic kidney disease, unspecified: Secondary | ICD-10-CM | POA: Diagnosis not present

## 2016-04-21 DIAGNOSIS — I4891 Unspecified atrial fibrillation: Secondary | ICD-10-CM | POA: Diagnosis not present

## 2016-04-22 DIAGNOSIS — I639 Cerebral infarction, unspecified: Secondary | ICD-10-CM | POA: Diagnosis not present

## 2016-04-22 DIAGNOSIS — Z8673 Personal history of transient ischemic attack (TIA), and cerebral infarction without residual deficits: Secondary | ICD-10-CM | POA: Diagnosis not present

## 2016-04-22 DIAGNOSIS — R1312 Dysphagia, oropharyngeal phase: Secondary | ICD-10-CM | POA: Diagnosis not present

## 2016-04-22 DIAGNOSIS — N189 Chronic kidney disease, unspecified: Secondary | ICD-10-CM | POA: Diagnosis not present

## 2016-04-22 DIAGNOSIS — E785 Hyperlipidemia, unspecified: Secondary | ICD-10-CM | POA: Diagnosis not present

## 2016-04-22 DIAGNOSIS — Z7901 Long term (current) use of anticoagulants: Secondary | ICD-10-CM | POA: Diagnosis not present

## 2016-04-22 DIAGNOSIS — E119 Type 2 diabetes mellitus without complications: Secondary | ICD-10-CM | POA: Diagnosis not present

## 2016-04-22 DIAGNOSIS — I129 Hypertensive chronic kidney disease with stage 1 through stage 4 chronic kidney disease, or unspecified chronic kidney disease: Secondary | ICD-10-CM | POA: Diagnosis not present

## 2016-04-22 DIAGNOSIS — I4891 Unspecified atrial fibrillation: Secondary | ICD-10-CM | POA: Diagnosis not present

## 2016-04-25 ENCOUNTER — Ambulatory Visit
Admission: RE | Admit: 2016-04-25 | Discharge: 2016-04-25 | Disposition: A | Discharge status: Home / Self Care | Payer: Medicare Other | Source: Ambulatory Visit | Attending: Nephrology | Admitting: Nephrology

## 2016-04-25 DIAGNOSIS — E119 Type 2 diabetes mellitus without complications: Secondary | ICD-10-CM | POA: Diagnosis not present

## 2016-04-25 DIAGNOSIS — N189 Chronic kidney disease, unspecified: Secondary | ICD-10-CM | POA: Diagnosis not present

## 2016-04-25 DIAGNOSIS — I129 Hypertensive chronic kidney disease with stage 1 through stage 4 chronic kidney disease, or unspecified chronic kidney disease: Secondary | ICD-10-CM | POA: Diagnosis not present

## 2016-04-25 DIAGNOSIS — E785 Hyperlipidemia, unspecified: Secondary | ICD-10-CM | POA: Diagnosis not present

## 2016-04-25 DIAGNOSIS — I4891 Unspecified atrial fibrillation: Secondary | ICD-10-CM | POA: Diagnosis not present

## 2016-04-25 DIAGNOSIS — Z8673 Personal history of transient ischemic attack (TIA), and cerebral infarction without residual deficits: Secondary | ICD-10-CM | POA: Diagnosis not present

## 2016-04-26 DIAGNOSIS — Z8673 Personal history of transient ischemic attack (TIA), and cerebral infarction without residual deficits: Secondary | ICD-10-CM | POA: Diagnosis not present

## 2016-04-26 DIAGNOSIS — I639 Cerebral infarction, unspecified: Secondary | ICD-10-CM | POA: Diagnosis not present

## 2016-04-26 DIAGNOSIS — Z7901 Long term (current) use of anticoagulants: Secondary | ICD-10-CM | POA: Diagnosis not present

## 2016-04-26 DIAGNOSIS — I4891 Unspecified atrial fibrillation: Secondary | ICD-10-CM | POA: Diagnosis not present

## 2016-04-26 DIAGNOSIS — E785 Hyperlipidemia, unspecified: Secondary | ICD-10-CM | POA: Diagnosis not present

## 2016-04-26 DIAGNOSIS — I129 Hypertensive chronic kidney disease with stage 1 through stage 4 chronic kidney disease, or unspecified chronic kidney disease: Secondary | ICD-10-CM | POA: Diagnosis not present

## 2016-04-26 DIAGNOSIS — N189 Chronic kidney disease, unspecified: Secondary | ICD-10-CM | POA: Diagnosis not present

## 2016-04-26 DIAGNOSIS — E119 Type 2 diabetes mellitus without complications: Secondary | ICD-10-CM | POA: Diagnosis not present

## 2016-04-26 DIAGNOSIS — R1312 Dysphagia, oropharyngeal phase: Secondary | ICD-10-CM | POA: Diagnosis not present

## 2016-04-29 ENCOUNTER — Other Ambulatory Visit (HOSPITAL_COMMUNITY): Payer: Self-pay | Admitting: *Deleted

## 2016-04-29 DIAGNOSIS — Z8673 Personal history of transient ischemic attack (TIA), and cerebral infarction without residual deficits: Secondary | ICD-10-CM | POA: Diagnosis not present

## 2016-04-29 DIAGNOSIS — I639 Cerebral infarction, unspecified: Secondary | ICD-10-CM | POA: Diagnosis not present

## 2016-04-29 DIAGNOSIS — E785 Hyperlipidemia, unspecified: Secondary | ICD-10-CM | POA: Diagnosis not present

## 2016-04-29 DIAGNOSIS — N189 Chronic kidney disease, unspecified: Secondary | ICD-10-CM | POA: Diagnosis not present

## 2016-04-29 DIAGNOSIS — I129 Hypertensive chronic kidney disease with stage 1 through stage 4 chronic kidney disease, or unspecified chronic kidney disease: Secondary | ICD-10-CM | POA: Diagnosis not present

## 2016-04-29 DIAGNOSIS — I4891 Unspecified atrial fibrillation: Secondary | ICD-10-CM | POA: Diagnosis not present

## 2016-04-29 DIAGNOSIS — Z7901 Long term (current) use of anticoagulants: Secondary | ICD-10-CM | POA: Diagnosis not present

## 2016-04-29 DIAGNOSIS — R1312 Dysphagia, oropharyngeal phase: Secondary | ICD-10-CM | POA: Diagnosis not present

## 2016-04-29 DIAGNOSIS — E119 Type 2 diabetes mellitus without complications: Secondary | ICD-10-CM | POA: Diagnosis not present

## 2016-05-02 ENCOUNTER — Ambulatory Visit (HOSPITAL_COMMUNITY): Payer: Medicare Other | Attending: Nephrology

## 2016-05-02 ENCOUNTER — Encounter (HOSPITAL_COMMUNITY): Payer: Self-pay

## 2016-05-02 DIAGNOSIS — I4891 Unspecified atrial fibrillation: Secondary | ICD-10-CM | POA: Diagnosis not present

## 2016-05-02 DIAGNOSIS — Z8673 Personal history of transient ischemic attack (TIA), and cerebral infarction without residual deficits: Secondary | ICD-10-CM | POA: Diagnosis not present

## 2016-05-02 DIAGNOSIS — N189 Chronic kidney disease, unspecified: Secondary | ICD-10-CM | POA: Diagnosis not present

## 2016-05-02 DIAGNOSIS — E119 Type 2 diabetes mellitus without complications: Secondary | ICD-10-CM | POA: Diagnosis not present

## 2016-05-02 DIAGNOSIS — E785 Hyperlipidemia, unspecified: Secondary | ICD-10-CM | POA: Diagnosis not present

## 2016-05-02 DIAGNOSIS — I129 Hypertensive chronic kidney disease with stage 1 through stage 4 chronic kidney disease, or unspecified chronic kidney disease: Secondary | ICD-10-CM | POA: Diagnosis not present

## 2016-05-03 DIAGNOSIS — E785 Hyperlipidemia, unspecified: Secondary | ICD-10-CM | POA: Diagnosis not present

## 2016-05-03 DIAGNOSIS — N189 Chronic kidney disease, unspecified: Secondary | ICD-10-CM | POA: Diagnosis not present

## 2016-05-03 DIAGNOSIS — Z8673 Personal history of transient ischemic attack (TIA), and cerebral infarction without residual deficits: Secondary | ICD-10-CM | POA: Diagnosis not present

## 2016-05-03 DIAGNOSIS — E119 Type 2 diabetes mellitus without complications: Secondary | ICD-10-CM | POA: Diagnosis not present

## 2016-05-03 DIAGNOSIS — I4891 Unspecified atrial fibrillation: Secondary | ICD-10-CM | POA: Diagnosis not present

## 2016-05-03 DIAGNOSIS — I129 Hypertensive chronic kidney disease with stage 1 through stage 4 chronic kidney disease, or unspecified chronic kidney disease: Secondary | ICD-10-CM | POA: Diagnosis not present

## 2016-05-04 DIAGNOSIS — I129 Hypertensive chronic kidney disease with stage 1 through stage 4 chronic kidney disease, or unspecified chronic kidney disease: Secondary | ICD-10-CM | POA: Diagnosis not present

## 2016-05-04 DIAGNOSIS — E785 Hyperlipidemia, unspecified: Secondary | ICD-10-CM | POA: Diagnosis not present

## 2016-05-04 DIAGNOSIS — I4891 Unspecified atrial fibrillation: Secondary | ICD-10-CM | POA: Diagnosis not present

## 2016-05-04 DIAGNOSIS — E119 Type 2 diabetes mellitus without complications: Secondary | ICD-10-CM | POA: Diagnosis not present

## 2016-05-04 DIAGNOSIS — N189 Chronic kidney disease, unspecified: Secondary | ICD-10-CM | POA: Diagnosis not present

## 2016-05-04 DIAGNOSIS — Z8673 Personal history of transient ischemic attack (TIA), and cerebral infarction without residual deficits: Secondary | ICD-10-CM | POA: Diagnosis not present

## 2016-05-05 DIAGNOSIS — I4891 Unspecified atrial fibrillation: Secondary | ICD-10-CM | POA: Diagnosis not present

## 2016-05-05 DIAGNOSIS — I129 Hypertensive chronic kidney disease with stage 1 through stage 4 chronic kidney disease, or unspecified chronic kidney disease: Secondary | ICD-10-CM | POA: Diagnosis not present

## 2016-05-05 DIAGNOSIS — E785 Hyperlipidemia, unspecified: Secondary | ICD-10-CM | POA: Diagnosis not present

## 2016-05-05 DIAGNOSIS — E119 Type 2 diabetes mellitus without complications: Secondary | ICD-10-CM | POA: Diagnosis not present

## 2016-05-05 DIAGNOSIS — N189 Chronic kidney disease, unspecified: Secondary | ICD-10-CM | POA: Diagnosis not present

## 2016-05-05 DIAGNOSIS — Z8673 Personal history of transient ischemic attack (TIA), and cerebral infarction without residual deficits: Secondary | ICD-10-CM | POA: Diagnosis not present

## 2016-05-06 DIAGNOSIS — Z7901 Long term (current) use of anticoagulants: Secondary | ICD-10-CM | POA: Diagnosis not present

## 2016-05-06 DIAGNOSIS — E1165 Type 2 diabetes mellitus with hyperglycemia: Secondary | ICD-10-CM | POA: Diagnosis not present

## 2016-05-06 DIAGNOSIS — L0292 Furuncle, unspecified: Secondary | ICD-10-CM | POA: Diagnosis not present

## 2016-05-06 DIAGNOSIS — I10 Essential (primary) hypertension: Secondary | ICD-10-CM | POA: Diagnosis not present

## 2016-05-06 DIAGNOSIS — R1312 Dysphagia, oropharyngeal phase: Secondary | ICD-10-CM | POA: Diagnosis not present

## 2016-05-09 DIAGNOSIS — E785 Hyperlipidemia, unspecified: Secondary | ICD-10-CM | POA: Diagnosis not present

## 2016-05-09 DIAGNOSIS — I4891 Unspecified atrial fibrillation: Secondary | ICD-10-CM | POA: Diagnosis not present

## 2016-05-09 DIAGNOSIS — E119 Type 2 diabetes mellitus without complications: Secondary | ICD-10-CM | POA: Diagnosis not present

## 2016-05-09 DIAGNOSIS — Z8673 Personal history of transient ischemic attack (TIA), and cerebral infarction without residual deficits: Secondary | ICD-10-CM | POA: Diagnosis not present

## 2016-05-09 DIAGNOSIS — N189 Chronic kidney disease, unspecified: Secondary | ICD-10-CM | POA: Diagnosis not present

## 2016-05-09 DIAGNOSIS — I129 Hypertensive chronic kidney disease with stage 1 through stage 4 chronic kidney disease, or unspecified chronic kidney disease: Secondary | ICD-10-CM | POA: Diagnosis not present

## 2016-05-10 DIAGNOSIS — R1312 Dysphagia, oropharyngeal phase: Secondary | ICD-10-CM | POA: Diagnosis not present

## 2016-05-10 DIAGNOSIS — E119 Type 2 diabetes mellitus without complications: Secondary | ICD-10-CM | POA: Diagnosis not present

## 2016-05-10 DIAGNOSIS — I4891 Unspecified atrial fibrillation: Secondary | ICD-10-CM | POA: Diagnosis not present

## 2016-05-10 DIAGNOSIS — I639 Cerebral infarction, unspecified: Secondary | ICD-10-CM | POA: Diagnosis not present

## 2016-05-10 DIAGNOSIS — Z8673 Personal history of transient ischemic attack (TIA), and cerebral infarction without residual deficits: Secondary | ICD-10-CM | POA: Diagnosis not present

## 2016-05-10 DIAGNOSIS — Z7901 Long term (current) use of anticoagulants: Secondary | ICD-10-CM | POA: Diagnosis not present

## 2016-05-10 DIAGNOSIS — I129 Hypertensive chronic kidney disease with stage 1 through stage 4 chronic kidney disease, or unspecified chronic kidney disease: Secondary | ICD-10-CM | POA: Diagnosis not present

## 2016-05-10 DIAGNOSIS — N189 Chronic kidney disease, unspecified: Secondary | ICD-10-CM | POA: Diagnosis not present

## 2016-05-10 DIAGNOSIS — E785 Hyperlipidemia, unspecified: Secondary | ICD-10-CM | POA: Diagnosis not present

## 2016-05-12 ENCOUNTER — Ambulatory Visit (HOSPITAL_COMMUNITY)
Admission: RE | Admit: 2016-05-12 | Discharge: 2016-05-12 | Disposition: A | Discharge status: Home / Self Care | Payer: Medicare Other | Source: Ambulatory Visit | Attending: Nephrology | Admitting: Nephrology

## 2016-05-12 DIAGNOSIS — D509 Iron deficiency anemia, unspecified: Secondary | ICD-10-CM | POA: Insufficient documentation

## 2016-05-12 DIAGNOSIS — E785 Hyperlipidemia, unspecified: Secondary | ICD-10-CM | POA: Diagnosis not present

## 2016-05-12 DIAGNOSIS — Z8673 Personal history of transient ischemic attack (TIA), and cerebral infarction without residual deficits: Secondary | ICD-10-CM | POA: Diagnosis not present

## 2016-05-12 DIAGNOSIS — I129 Hypertensive chronic kidney disease with stage 1 through stage 4 chronic kidney disease, or unspecified chronic kidney disease: Secondary | ICD-10-CM | POA: Diagnosis not present

## 2016-05-12 DIAGNOSIS — I4891 Unspecified atrial fibrillation: Secondary | ICD-10-CM | POA: Diagnosis not present

## 2016-05-12 DIAGNOSIS — N189 Chronic kidney disease, unspecified: Secondary | ICD-10-CM | POA: Diagnosis not present

## 2016-05-12 DIAGNOSIS — E119 Type 2 diabetes mellitus without complications: Secondary | ICD-10-CM | POA: Diagnosis not present

## 2016-05-12 MED ORDER — SODIUM CHLORIDE 0.9 % IV SOLN
510.0000 mg | INTRAVENOUS | Status: DC
  Administered 2016-05-12: 510 mg via INTRAVENOUS
  Filled 2016-05-12: qty 17

## 2016-05-12 NOTE — Discharge Instructions (Signed)
Ferumoxytol injection What is this medicine? FERUMOXYTOL is an iron complex. Iron is used to make healthy red blood cells, which carry oxygen and nutrients throughout the body. This medicine is used to treat iron deficiency anemia in people with chronic kidney disease. COMMON BRAND NAME(S): Feraheme What should I tell my health care provider before I take this medicine? They need to know if you have any of these conditions: -anemia not caused by low iron levels -high levels of iron in the blood -magnetic resonance imaging (MRI) test scheduled -an unusual or allergic reaction to iron, other medicines, foods, dyes, or preservatives -pregnant or trying to get pregnant -breast-feeding How should I use this medicine? This medicine is for injection into a vein. It is given by a health care professional in a hospital or clinic setting. Talk to your pediatrician regarding the use of this medicine in children. Special care may be needed. What if I miss a dose? It is important not to miss your dose. Call your doctor or health care professional if you are unable to keep an appointment. What may interact with this medicine? This medicine may interact with the following medications: -other iron products What should I watch for while using this medicine? Visit your doctor or healthcare professional regularly. Tell your doctor or healthcare professional if your symptoms do not start to get better or if they get worse. You may need blood work done while you are taking this medicine. You may need to follow a special diet. Talk to your doctor. Foods that contain iron include: whole grains/cereals, dried fruits, beans, or peas, leafy green vegetables, and organ meats (liver, kidney). What side effects may I notice from receiving this medicine? Side effects that you should report to your doctor or health care professional as soon as possible: -allergic reactions like skin rash, itching or hives, swelling of the  face, lips, or tongue -breathing problems -changes in blood pressure -feeling faint or lightheaded, falls -fever or chills -flushing, sweating, or hot feelings -swelling of the ankles or feet Side effects that usually do not require medical attention (report to your doctor or health care professional if they continue or are bothersome): -diarrhea -headache -nausea, vomiting -stomach pain Where should I keep my medicine? This drug is given in a hospital or clinic and will not be stored at home.  2017 Elsevier/Gold Standard (2015-04-30 12:41:49)  

## 2016-05-13 DIAGNOSIS — Z8673 Personal history of transient ischemic attack (TIA), and cerebral infarction without residual deficits: Secondary | ICD-10-CM | POA: Diagnosis not present

## 2016-05-13 DIAGNOSIS — E785 Hyperlipidemia, unspecified: Secondary | ICD-10-CM | POA: Diagnosis not present

## 2016-05-13 DIAGNOSIS — R1312 Dysphagia, oropharyngeal phase: Secondary | ICD-10-CM | POA: Diagnosis not present

## 2016-05-13 DIAGNOSIS — N189 Chronic kidney disease, unspecified: Secondary | ICD-10-CM | POA: Diagnosis not present

## 2016-05-13 DIAGNOSIS — I4891 Unspecified atrial fibrillation: Secondary | ICD-10-CM | POA: Diagnosis not present

## 2016-05-13 DIAGNOSIS — I639 Cerebral infarction, unspecified: Secondary | ICD-10-CM | POA: Diagnosis not present

## 2016-05-13 DIAGNOSIS — E119 Type 2 diabetes mellitus without complications: Secondary | ICD-10-CM | POA: Diagnosis not present

## 2016-05-13 DIAGNOSIS — Z7901 Long term (current) use of anticoagulants: Secondary | ICD-10-CM | POA: Diagnosis not present

## 2016-05-13 DIAGNOSIS — I129 Hypertensive chronic kidney disease with stage 1 through stage 4 chronic kidney disease, or unspecified chronic kidney disease: Secondary | ICD-10-CM | POA: Diagnosis not present

## 2016-05-16 DIAGNOSIS — Z5181 Encounter for therapeutic drug level monitoring: Secondary | ICD-10-CM | POA: Diagnosis not present

## 2016-05-16 DIAGNOSIS — E785 Hyperlipidemia, unspecified: Secondary | ICD-10-CM | POA: Diagnosis not present

## 2016-05-16 DIAGNOSIS — I4891 Unspecified atrial fibrillation: Secondary | ICD-10-CM | POA: Diagnosis not present

## 2016-05-16 DIAGNOSIS — I129 Hypertensive chronic kidney disease with stage 1 through stage 4 chronic kidney disease, or unspecified chronic kidney disease: Secondary | ICD-10-CM | POA: Diagnosis not present

## 2016-05-16 DIAGNOSIS — N189 Chronic kidney disease, unspecified: Secondary | ICD-10-CM | POA: Diagnosis not present

## 2016-05-16 DIAGNOSIS — E1122 Type 2 diabetes mellitus with diabetic chronic kidney disease: Secondary | ICD-10-CM | POA: Diagnosis not present

## 2016-05-17 DIAGNOSIS — E1122 Type 2 diabetes mellitus with diabetic chronic kidney disease: Secondary | ICD-10-CM | POA: Diagnosis not present

## 2016-05-17 DIAGNOSIS — E785 Hyperlipidemia, unspecified: Secondary | ICD-10-CM | POA: Diagnosis not present

## 2016-05-17 DIAGNOSIS — Z5181 Encounter for therapeutic drug level monitoring: Secondary | ICD-10-CM | POA: Diagnosis not present

## 2016-05-17 DIAGNOSIS — I129 Hypertensive chronic kidney disease with stage 1 through stage 4 chronic kidney disease, or unspecified chronic kidney disease: Secondary | ICD-10-CM | POA: Diagnosis not present

## 2016-05-17 DIAGNOSIS — I4891 Unspecified atrial fibrillation: Secondary | ICD-10-CM | POA: Diagnosis not present

## 2016-05-17 DIAGNOSIS — N189 Chronic kidney disease, unspecified: Secondary | ICD-10-CM | POA: Diagnosis not present

## 2016-05-19 ENCOUNTER — Ambulatory Visit (HOSPITAL_COMMUNITY)
Admission: RE | Admit: 2016-05-19 | Discharge: 2016-05-19 | Disposition: A | Discharge status: Home / Self Care | Payer: Medicare Other | Source: Ambulatory Visit | Attending: Nephrology | Admitting: Nephrology

## 2016-05-19 DIAGNOSIS — D509 Iron deficiency anemia, unspecified: Secondary | ICD-10-CM | POA: Diagnosis not present

## 2016-05-19 MED ORDER — SODIUM CHLORIDE 0.9 % IV SOLN
510.0000 mg | INTRAVENOUS | Status: AC
  Administered 2016-05-19: 510 mg via INTRAVENOUS
  Filled 2016-05-19: qty 17

## 2016-05-20 DIAGNOSIS — I4891 Unspecified atrial fibrillation: Secondary | ICD-10-CM | POA: Diagnosis not present

## 2016-05-20 DIAGNOSIS — Z7901 Long term (current) use of anticoagulants: Secondary | ICD-10-CM | POA: Diagnosis not present

## 2016-05-20 DIAGNOSIS — E1165 Type 2 diabetes mellitus with hyperglycemia: Secondary | ICD-10-CM | POA: Diagnosis not present

## 2016-05-20 DIAGNOSIS — D509 Iron deficiency anemia, unspecified: Secondary | ICD-10-CM | POA: Diagnosis not present

## 2016-05-20 DIAGNOSIS — R1312 Dysphagia, oropharyngeal phase: Secondary | ICD-10-CM | POA: Diagnosis not present

## 2016-05-20 DIAGNOSIS — I10 Essential (primary) hypertension: Secondary | ICD-10-CM | POA: Diagnosis not present

## 2016-05-20 DIAGNOSIS — I639 Cerebral infarction, unspecified: Secondary | ICD-10-CM | POA: Diagnosis not present

## 2016-05-20 DIAGNOSIS — N189 Chronic kidney disease, unspecified: Secondary | ICD-10-CM | POA: Diagnosis not present

## 2016-05-24 DIAGNOSIS — I129 Hypertensive chronic kidney disease with stage 1 through stage 4 chronic kidney disease, or unspecified chronic kidney disease: Secondary | ICD-10-CM | POA: Diagnosis not present

## 2016-05-24 DIAGNOSIS — E1122 Type 2 diabetes mellitus with diabetic chronic kidney disease: Secondary | ICD-10-CM | POA: Diagnosis not present

## 2016-05-24 DIAGNOSIS — E785 Hyperlipidemia, unspecified: Secondary | ICD-10-CM | POA: Diagnosis not present

## 2016-05-24 DIAGNOSIS — Z5181 Encounter for therapeutic drug level monitoring: Secondary | ICD-10-CM | POA: Diagnosis not present

## 2016-05-24 DIAGNOSIS — N189 Chronic kidney disease, unspecified: Secondary | ICD-10-CM | POA: Diagnosis not present

## 2016-05-24 DIAGNOSIS — I4891 Unspecified atrial fibrillation: Secondary | ICD-10-CM | POA: Diagnosis not present

## 2016-05-26 DIAGNOSIS — N189 Chronic kidney disease, unspecified: Secondary | ICD-10-CM | POA: Diagnosis not present

## 2016-05-26 DIAGNOSIS — I129 Hypertensive chronic kidney disease with stage 1 through stage 4 chronic kidney disease, or unspecified chronic kidney disease: Secondary | ICD-10-CM | POA: Diagnosis not present

## 2016-05-26 DIAGNOSIS — I4891 Unspecified atrial fibrillation: Secondary | ICD-10-CM | POA: Diagnosis not present

## 2016-05-26 DIAGNOSIS — R1312 Dysphagia, oropharyngeal phase: Secondary | ICD-10-CM | POA: Diagnosis not present

## 2016-05-26 DIAGNOSIS — I639 Cerebral infarction, unspecified: Secondary | ICD-10-CM | POA: Diagnosis not present

## 2016-05-26 DIAGNOSIS — E785 Hyperlipidemia, unspecified: Secondary | ICD-10-CM | POA: Diagnosis not present

## 2016-05-26 DIAGNOSIS — Z7901 Long term (current) use of anticoagulants: Secondary | ICD-10-CM | POA: Diagnosis not present

## 2016-05-26 DIAGNOSIS — Z5181 Encounter for therapeutic drug level monitoring: Secondary | ICD-10-CM | POA: Diagnosis not present

## 2016-05-26 DIAGNOSIS — E1122 Type 2 diabetes mellitus with diabetic chronic kidney disease: Secondary | ICD-10-CM | POA: Diagnosis not present

## 2016-05-30 DIAGNOSIS — N390 Urinary tract infection, site not specified: Secondary | ICD-10-CM | POA: Diagnosis not present

## 2016-05-30 DIAGNOSIS — E785 Hyperlipidemia, unspecified: Secondary | ICD-10-CM | POA: Diagnosis not present

## 2016-05-30 DIAGNOSIS — I4891 Unspecified atrial fibrillation: Secondary | ICD-10-CM | POA: Diagnosis not present

## 2016-05-30 DIAGNOSIS — E1129 Type 2 diabetes mellitus with other diabetic kidney complication: Secondary | ICD-10-CM | POA: Diagnosis not present

## 2016-05-30 DIAGNOSIS — Z5181 Encounter for therapeutic drug level monitoring: Secondary | ICD-10-CM | POA: Diagnosis not present

## 2016-05-30 DIAGNOSIS — N183 Chronic kidney disease, stage 3 (moderate): Secondary | ICD-10-CM | POA: Diagnosis not present

## 2016-05-30 DIAGNOSIS — N189 Chronic kidney disease, unspecified: Secondary | ICD-10-CM | POA: Diagnosis not present

## 2016-05-30 DIAGNOSIS — D631 Anemia in chronic kidney disease: Secondary | ICD-10-CM | POA: Diagnosis not present

## 2016-05-30 DIAGNOSIS — I129 Hypertensive chronic kidney disease with stage 1 through stage 4 chronic kidney disease, or unspecified chronic kidney disease: Secondary | ICD-10-CM | POA: Diagnosis not present

## 2016-05-30 DIAGNOSIS — Z6830 Body mass index (BMI) 30.0-30.9, adult: Secondary | ICD-10-CM | POA: Diagnosis not present

## 2016-05-30 DIAGNOSIS — E1122 Type 2 diabetes mellitus with diabetic chronic kidney disease: Secondary | ICD-10-CM | POA: Diagnosis not present

## 2016-05-31 ENCOUNTER — Encounter (HOSPITAL_COMMUNITY): Payer: Self-pay | Admitting: *Deleted

## 2016-05-31 ENCOUNTER — Other Ambulatory Visit: Payer: Self-pay | Admitting: Gastroenterology

## 2016-05-31 NOTE — Anesthesia Preprocedure Evaluation (Addendum)
Anesthesia Evaluation  Patient identified by MRN, date of birth, ID band Patient awake    Reviewed: Allergy & Precautions, H&P , NPO status , Patient's Chart, lab work & pertinent test results  Airway Mallampati: II  TM Distance: >3 FB Neck ROM: Full    Dental no notable dental hx. (+) Partial Lower, Partial Upper, Dental Advisory Given   Pulmonary asthma , former smoker,    Pulmonary exam normal breath sounds clear to auscultation       Cardiovascular hypertension, Pt. on medications and Pt. on home beta blockers + dysrhythmias Atrial Fibrillation  Rhythm:Regular Rate:Normal     Neuro/Psych CVA, Residual Symptoms negative psych ROS   GI/Hepatic Neg liver ROS, GERD  Medicated and Controlled,  Endo/Other  diabetes, Type 1, Insulin Dependent  Renal/GU negative Renal ROS  negative genitourinary   Musculoskeletal   Abdominal   Peds  Hematology negative hematology ROS (+) anemia ,   Anesthesia Other Findings   Reproductive/Obstetrics negative OB ROS                           Anesthesia Physical Anesthesia Plan  ASA: III  Anesthesia Plan: MAC   Post-op Pain Management:    Induction: Intravenous  Airway Management Planned: Nasal Cannula  Additional Equipment:   Intra-op Plan:   Post-operative Plan:   Informed Consent: I have reviewed the patients History and Physical, chart, labs and discussed the procedure including the risks, benefits and alternatives for the proposed anesthesia with the patient or authorized representative who has indicated his/her understanding and acceptance.   Dental advisory given  Plan Discussed with: CRNA  Anesthesia Plan Comments:         Anesthesia Quick Evaluation

## 2016-05-31 NOTE — Progress Notes (Signed)
Spoke with Tracy Elliott, pt's niece for pre-op call. Pt has a hx of A-fib. Is on Coumadin. Pt is diabetic, type 2. Last A1C was 9.9 per Dr. Caroline More notes in [place] on 05/20/16. Tracy Elliott states pt's fasting blood sugar is usually between 80-150. Instructed Tracy Elliott to have pt take 1/2 of her regular Basaglar Insulin this evening. Instructed her to have pt check her blood sugar in the AM when she gets up and every 2 hours until she leaves for the hospital. If blood sugar is >220 take 1/2 of usual correction dose of Humalog insulin. If blood sugar is 70 or below, treat with 1/2 cup of clear juice (apple or cranberry) and recheck blood sugar 15 minutes after drinking juice. If blood sugar continues to be 70 or below, call the Endoscopy department and ask to speak to a nurse. Tracy Elliott voiced understanding.   Coumadin last dose was 05/26/16.

## 2016-06-01 ENCOUNTER — Ambulatory Visit (HOSPITAL_COMMUNITY): Payer: Medicare Other | Admitting: Anesthesiology

## 2016-06-01 ENCOUNTER — Encounter (HOSPITAL_COMMUNITY)
Admission: RE | Disposition: A | Discharge status: Home / Self Care | Payer: Self-pay | Source: Ambulatory Visit | Attending: Gastroenterology

## 2016-06-01 ENCOUNTER — Encounter (HOSPITAL_COMMUNITY): Payer: Self-pay | Admitting: *Deleted

## 2016-06-01 ENCOUNTER — Ambulatory Visit (HOSPITAL_COMMUNITY)
Admission: RE | Admit: 2016-06-01 | Discharge: 2016-06-01 | Disposition: A | Discharge status: Home / Self Care | Payer: Medicare Other | Source: Ambulatory Visit | Attending: Gastroenterology | Admitting: Gastroenterology

## 2016-06-01 DIAGNOSIS — Z87891 Personal history of nicotine dependence: Secondary | ICD-10-CM | POA: Diagnosis not present

## 2016-06-01 DIAGNOSIS — E785 Hyperlipidemia, unspecified: Secondary | ICD-10-CM | POA: Diagnosis not present

## 2016-06-01 DIAGNOSIS — D125 Benign neoplasm of sigmoid colon: Secondary | ICD-10-CM | POA: Diagnosis not present

## 2016-06-01 DIAGNOSIS — K635 Polyp of colon: Secondary | ICD-10-CM | POA: Diagnosis not present

## 2016-06-01 DIAGNOSIS — I129 Hypertensive chronic kidney disease with stage 1 through stage 4 chronic kidney disease, or unspecified chronic kidney disease: Secondary | ICD-10-CM | POA: Insufficient documentation

## 2016-06-01 DIAGNOSIS — R195 Other fecal abnormalities: Secondary | ICD-10-CM | POA: Insufficient documentation

## 2016-06-01 DIAGNOSIS — N189 Chronic kidney disease, unspecified: Secondary | ICD-10-CM | POA: Insufficient documentation

## 2016-06-01 DIAGNOSIS — E1122 Type 2 diabetes mellitus with diabetic chronic kidney disease: Secondary | ICD-10-CM | POA: Insufficient documentation

## 2016-06-01 DIAGNOSIS — Z7982 Long term (current) use of aspirin: Secondary | ICD-10-CM | POA: Diagnosis not present

## 2016-06-01 DIAGNOSIS — D649 Anemia, unspecified: Secondary | ICD-10-CM | POA: Diagnosis not present

## 2016-06-01 DIAGNOSIS — K640 First degree hemorrhoids: Secondary | ICD-10-CM | POA: Insufficient documentation

## 2016-06-01 DIAGNOSIS — I4891 Unspecified atrial fibrillation: Secondary | ICD-10-CM | POA: Insufficient documentation

## 2016-06-01 DIAGNOSIS — Z79899 Other long term (current) drug therapy: Secondary | ICD-10-CM | POA: Diagnosis not present

## 2016-06-01 DIAGNOSIS — Z7901 Long term (current) use of anticoagulants: Secondary | ICD-10-CM | POA: Insufficient documentation

## 2016-06-01 DIAGNOSIS — Z8673 Personal history of transient ischemic attack (TIA), and cerebral infarction without residual deficits: Secondary | ICD-10-CM | POA: Insufficient documentation

## 2016-06-01 DIAGNOSIS — Z931 Gastrostomy status: Secondary | ICD-10-CM | POA: Diagnosis not present

## 2016-06-01 DIAGNOSIS — Z794 Long term (current) use of insulin: Secondary | ICD-10-CM | POA: Diagnosis not present

## 2016-06-01 DIAGNOSIS — I10 Essential (primary) hypertension: Secondary | ICD-10-CM | POA: Diagnosis not present

## 2016-06-01 DIAGNOSIS — D509 Iron deficiency anemia, unspecified: Secondary | ICD-10-CM | POA: Diagnosis present

## 2016-06-01 HISTORY — DX: Unspecified atrial fibrillation: I48.91

## 2016-06-01 HISTORY — PX: ESOPHAGOGASTRODUODENOSCOPY (EGD) WITH PROPOFOL: SHX5813

## 2016-06-01 HISTORY — DX: Anemia, unspecified: D64.9

## 2016-06-01 HISTORY — DX: Gastro-esophageal reflux disease without esophagitis: K21.9

## 2016-06-01 HISTORY — DX: Essential (primary) hypertension: I10

## 2016-06-01 HISTORY — DX: Dysphagia, unspecified: R13.10

## 2016-06-01 HISTORY — DX: Cerebral infarction, unspecified: I63.9

## 2016-06-01 HISTORY — DX: Type 2 diabetes mellitus without complications: E11.9

## 2016-06-01 HISTORY — DX: Chronic kidney disease, unspecified: N18.9

## 2016-06-01 HISTORY — PX: COLONOSCOPY WITH PROPOFOL: SHX5780

## 2016-06-01 HISTORY — DX: Unspecified asthma, uncomplicated: J45.909

## 2016-06-01 LAB — GLUCOSE, CAPILLARY: Glucose-Capillary: 75 mg/dL (ref 65–99)

## 2016-06-01 SURGERY — ESOPHAGOGASTRODUODENOSCOPY (EGD) WITH PROPOFOL
Anesthesia: Monitor Anesthesia Care

## 2016-06-01 MED ORDER — MIDAZOLAM HCL 5 MG/5ML IJ SOLN
INTRAMUSCULAR | Status: DC | PRN
  Administered 2016-06-01: 2 mg via INTRAVENOUS

## 2016-06-01 MED ORDER — SODIUM CHLORIDE 0.9 % IV SOLN: INTRAVENOUS | Status: DC

## 2016-06-01 MED ORDER — PROPOFOL 500 MG/50ML IV EMUL
INTRAVENOUS | Status: DC | PRN
  Administered 2016-06-01: 100 ug/kg/min via INTRAVENOUS

## 2016-06-01 MED ORDER — PROPOFOL 10 MG/ML IV BOLUS
INTRAVENOUS | Status: DC | PRN
  Administered 2016-06-01 (×3): 10 mg via INTRAVENOUS

## 2016-06-01 MED ORDER — LACTATED RINGERS IV SOLN
INTRAVENOUS | Status: DC | PRN
  Administered 2016-06-01: 09:00:00 via INTRAVENOUS

## 2016-06-01 NOTE — Brief Op Note (Signed)
PEG tube removal due to not being used for nutrition in over 2 months.  PEG tube removed without difficulty and pressure applied with gauze. No immediate bleeding seen. Gauze left in place with tape applied. No immediate complications.

## 2016-06-01 NOTE — Interval H&P Note (Signed)
History and Physical Interval Note:  06/01/2016 8:29 AM  Tracy Elliott  has presented today for surgery, with the diagnosis of anemia/hem.positive stool  The various methods of treatment have been discussed with the patient and family. After consideration of risks, benefits and other options for treatment, the patient has consented to  Procedure(s): ESOPHAGOGASTRODUODENOSCOPY (EGD) WITH PROPOFOL (N/A) COLONOSCOPY WITH PROPOFOL (N/A) as a surgical intervention .  The patient's history has been reviewed, patient examined, no change in status, stable for surgery.  I have reviewed the patient's chart and labs.  Questions were answered to the patient's satisfaction.     Ripley C.

## 2016-06-01 NOTE — Discharge Instructions (Signed)

## 2016-06-01 NOTE — Op Note (Signed)
[facility] Patient Name: Tracy Elliott Procedure Date : 06/01/2016 MRN: [id number] Attending MD: Lear Ng , MD Date of Birth: 1946-02-14 CSN: [id number] Age: 71 Admit Type: Outpatient Procedure:                Upper GI endoscopy Indications:              Iron deficiency anemia, Heme positive stool Providers:                Lear Ng, MD, Cleda Daub, RN, Elspeth Cho Tech., Technician, Trixie Deis, CRNA Referring MD:              Medicines:                Propofol per Anesthesia, Monitored Anesthesia Care Complications:            No immediate complications. Estimated Blood Loss:     Estimated blood loss: none. Procedure:                Pre-Anesthesia Assessment:                           - Prior to the procedure, a History and Physical                            was performed, and patient medications and                            allergies were reviewed. The patient's tolerance of                            previous anesthesia was also reviewed. The risks                            and benefits of the procedure and the sedation                            options and risks were discussed with the patient.                            All questions were answered, and informed consent                            was obtained. Prior Anticoagulants: The patient has                            taken Coumadin (warfarin), last dose was 5 days                            prior to procedure. ASA Grade Assessment: III - A                            patient with severe systemic disease. After  reviewing the risks and benefits, the patient was                            deemed in satisfactory condition to undergo the                            procedure.                           After obtaining informed consent, the endoscope was                            passed under direct vision. Throughout the                  procedure, the patient's blood pressure, pulse, and                            oxygen saturations were monitored continuously. The                            Endoscope was introduced through the mouth, and                            advanced to the second part of duodenum. The upper                            GI endoscopy was accomplished without difficulty.                            The patient tolerated the procedure well. Scope In: Scope Out: Findings:      The examined esophagus was normal.      There was evidence of an intact gastrostomy with a patent G-tube present       in the gastric body. This was characterized by healthy appearing mucosa.      The exam of the stomach was otherwise normal.      The cardia and gastric fundus were normal on retroflexion.      The examined duodenum was normal. Impression:               - Normal esophagus.                           - Intact gastrostomy with a patent G-tube present                            characterized by healthy appearing mucosa.                           - Normal examined duodenum.                           - No specimens collected. Moderate Sedation:      N/A - MAC procedure Recommendation:           - Patient has a contact number available for  emergencies. The signs and symptoms of potential                            delayed complications were discussed with the                            patient. Return to normal activities tomorrow.                            Written discharge instructions were provided to the                            patient.                           - Proceed with Colonoscopy (see colonoscopy report                            for recs regarding anticoagulation).                           - Resume previous diet.                           - Post procedure medication orders were given. Procedure Code(s):        --- Professional ---                            [phone], Esophagogastroduodenoscopy, flexible,                            transoral; diagnostic, including collection of                            specimen(s) by brushing or washing, when performed                            (separate procedure) Diagnosis Code(s):        --- Professional ---                           D50.9, Iron deficiency anemia, unspecified                           R19.5, Other fecal abnormalities                           Z93.1, Gastrostomy status CPT copyright 2016 American Medical Association. All rights reserved. The codes documented in this report are preliminary and upon coder review may  be revised to meet current compliance requirements. Lear Ng, MD 06/01/2016 10:11:12 AM This report has been signed electronically. Number of Addenda: 0

## 2016-06-01 NOTE — H&P (Signed)
Date of Initial H&P: 05/26/16  History reviewed, patient examined, no change in status, stable for surgery.

## 2016-06-01 NOTE — Transfer of Care (Signed)
Immediate Anesthesia Transfer of Care Note  Patient: Tracy Elliott  Procedure(s) Performed: Procedure(s): ESOPHAGOGASTRODUODENOSCOPY (EGD) WITH PROPOFOL (N/A) COLONOSCOPY WITH PROPOFOL (N/A)  Patient Location: Endoscopy Unit  Anesthesia Type:MAC  Level of Consciousness: sedated  Airway & Oxygen Therapy: Patient Spontanous Breathing and Patient connected to nasal cannula oxygen  Post-op Assessment: Report given to RN and Post -op Vital signs reviewed and stable  Post vital signs: Reviewed and stable  Last Vitals:  Vitals:   06/01/16 0815 06/01/16 0958  BP: (!) 175/69 (!) 135/57  Pulse: (!) 58 (!) 50  Resp: 14 11  Temp: 36.9 C 36.4 C    Last Pain:  Vitals:   06/01/16 0958  TempSrc: Oral         Complications: No apparent anesthesia complications

## 2016-06-01 NOTE — Progress Notes (Signed)
Peg removal without difficulty,abdominal site no bleeding and within normal limits after removal, dry gauze dressing applied to abdomine.

## 2016-06-01 NOTE — Anesthesia Postprocedure Evaluation (Signed)
Anesthesia Post Note  Patient: Tracy Elliott  Procedure(s) Performed: Procedure(s) (LRB): ESOPHAGOGASTRODUODENOSCOPY (EGD) WITH PROPOFOL (N/A) COLONOSCOPY WITH PROPOFOL (N/A)  Patient location during evaluation: PACU Anesthesia Type: MAC Level of consciousness: awake and alert Pain management: pain level controlled Vital Signs Assessment: post-procedure vital signs reviewed and stable Respiratory status: spontaneous breathing, nonlabored ventilation, respiratory function stable and patient connected to nasal cannula oxygen Cardiovascular status: stable and blood pressure returned to baseline Anesthetic complications: no       Last Vitals:  Vitals:   06/01/16 1010 06/01/16 1020  BP: (!) 145/72 (!) 178/78  Pulse: (!) 59 (!) 52  Resp: 14 17  Temp:      Last Pain:  Vitals:   06/01/16 0958  TempSrc: Oral                 Geneve Kimpel,W. EDMOND

## 2016-06-01 NOTE — Op Note (Signed)
[facility] Patient Name: Tracy Elliott Procedure Date : 06/01/2016 MRN: [id number] Attending MD: Lear Ng , MD Date of Birth: [birth year] CSN: [id number] Age: 71 Admit Type: Outpatient Procedure:                Colonoscopy Indications:              Last colonoscopy: date unknown, Heme positive                            stool, Iron deficiency anemia Providers:                Lear Ng, MD, Cleda Daub, RN, Elspeth Cho Tech., Technician, Trixie Deis, CRNA Referring MD:              Medicines:                Propofol per Anesthesia, Monitored Anesthesia Care Complications:            No immediate complications. Estimated Blood Loss:     Estimated blood loss: none. Procedure:                Pre-Anesthesia Assessment:                           - Prior to the procedure, a History and Physical                            was performed, and patient medications and                            allergies were reviewed. The patient's tolerance of                            previous anesthesia was also reviewed. The risks                            and benefits of the procedure and the sedation                            options and risks were discussed with the patient.                            All questions were answered, and informed consent                            was obtained. Prior Anticoagulants: The patient has                            taken Coumadin (warfarin), last dose was 5 days                            prior to procedure. ASA Grade Assessment: III - A  patient with severe systemic disease. After                            reviewing the risks and benefits, the patient was                            deemed in satisfactory condition to undergo the                            procedure.                           - Prior to the procedure, a History and Physical                            was  performed, and patient medications and                            allergies were reviewed. The patient's tolerance of                            previous anesthesia was also reviewed. The risks                            and benefits of the procedure and the sedation                            options and risks were discussed with the patient.                            All questions were answered, and informed consent                            was obtained. Prior Anticoagulants: The patient has                            taken Coumadin (warfarin), last dose was 5 days                            prior to procedure. ASA Grade Assessment: III - A                            patient with severe systemic disease. After                            reviewing the risks and benefits, the patient was                            deemed in satisfactory condition to undergo the                            procedure.  After obtaining informed consent, the colonoscope                            was passed under direct vision. Throughout the                            procedure, the patient's blood pressure, pulse, and                            oxygen saturations were monitored continuously. The                            EC-[id number] ([id number]) scope was introduced through                            the anus and advanced to the the cecum, identified                            by appendiceal orifice and ileocecal valve. The                            colonoscopy was performed without difficulty. The                            patient tolerated the procedure well. The quality                            of the bowel preparation was adequate and good. The                            terminal ileum, the appendiceal orifice and the                            rectum were photographed. Scope In: 9:31:58 AM Scope Out: 9:47:11 AM Scope Withdrawal Time: 0 hours 6 minutes 4 seconds  Total  Procedure Duration: 0 hours 15 minutes 13 seconds  Findings:      The perianal and digital rectal examinations were normal.      A 12 mm polyp was found in the sigmoid colon. The polyp was       semi-pedunculated. The polyp was removed with a hot snare. Resection and       retrieval were complete. Estimated blood loss: none.      Internal hemorrhoids were found during retroflexion. The hemorrhoids       were small and Grade I (internal hemorrhoids that do not prolapse).      The terminal ileum appeared normal. Impression:               - One 12 mm polyp in the sigmoid colon, removed                            with a hot snare. Resected and retrieved.                           - Internal hemorrhoids.                           -  The examined portion of the ileum was normal. Moderate Sedation:      N/A - MAC procedure Recommendation:           - Patient has a contact number available for                            emergencies. The signs and symptoms of potential                            delayed complications were discussed with the                            patient. Return to normal activities tomorrow.                            Written discharge instructions were provided to the                            patient.                           - Resume previous diet.                           - Await pathology results.                           - Resume Coumadin (warfarin) at prior dose today.                            Refer to managing physician for further adjustment                            of therapy.                           - Repeat colonoscopy for surveillance based on                            pathology results. Procedure Code(s):        --- Professional ---                           [phone], Colonoscopy, flexible; with removal of                            tumor(s), polyp(s), or other lesion(s) by snare                            technique Diagnosis Code(s):        ---  Professional ---                           D50.9, Iron deficiency anemia, unspecified                           R19.5, Other fecal abnormalities  D12.5, Benign neoplasm of sigmoid colon                           K64.0, First degree hemorrhoids CPT copyright 2016 American Medical Association. All rights reserved. The codes documented in this report are preliminary and upon coder review may  be revised to meet current compliance requirements. Lear Ng, MD 06/01/2016 10:18:26 AM This report has been signed electronically. Number of Addenda: 0

## 2016-06-01 NOTE — Anesthesia Procedure Notes (Signed)
Procedure Name: MAC Date/Time: 06/01/2016 9:05 AM Performed by: Trixie Deis A Oxygen Delivery Method: Nasal cannula Placement Confirmation: positive ETCO2 Dental Injury: Teeth and Oropharynx as per pre-operative assessment

## 2016-06-02 ENCOUNTER — Encounter (HOSPITAL_COMMUNITY): Payer: Self-pay | Admitting: Gastroenterology

## 2016-06-02 LAB — IGG, IGA, IGM
IGA: 240 mg/dL (ref 87–352)
IGM, SERUM: 97 mg/dL (ref 26–217)
IgG (Immunoglobin G), Serum: 1527 mg/dL (ref 700–1600)

## 2016-06-02 LAB — TISSUE TRANSGLUTAMINASE, IGA: Tissue Transglutaminase Ab, IgA: <2 U/mL (ref 0–3)

## 2016-06-06 DIAGNOSIS — I4891 Unspecified atrial fibrillation: Secondary | ICD-10-CM | POA: Diagnosis not present

## 2016-06-06 DIAGNOSIS — R1312 Dysphagia, oropharyngeal phase: Secondary | ICD-10-CM | POA: Diagnosis not present

## 2016-06-06 DIAGNOSIS — Z7901 Long term (current) use of anticoagulants: Secondary | ICD-10-CM | POA: Diagnosis not present

## 2016-06-06 DIAGNOSIS — I639 Cerebral infarction, unspecified: Secondary | ICD-10-CM | POA: Diagnosis not present

## 2016-06-09 DIAGNOSIS — N189 Chronic kidney disease, unspecified: Secondary | ICD-10-CM | POA: Diagnosis not present

## 2016-06-09 DIAGNOSIS — I129 Hypertensive chronic kidney disease with stage 1 through stage 4 chronic kidney disease, or unspecified chronic kidney disease: Secondary | ICD-10-CM | POA: Diagnosis not present

## 2016-06-09 DIAGNOSIS — E1122 Type 2 diabetes mellitus with diabetic chronic kidney disease: Secondary | ICD-10-CM | POA: Diagnosis not present

## 2016-06-09 DIAGNOSIS — I4891 Unspecified atrial fibrillation: Secondary | ICD-10-CM | POA: Diagnosis not present

## 2016-06-09 DIAGNOSIS — Z5181 Encounter for therapeutic drug level monitoring: Secondary | ICD-10-CM | POA: Diagnosis not present

## 2016-06-09 DIAGNOSIS — E785 Hyperlipidemia, unspecified: Secondary | ICD-10-CM | POA: Diagnosis not present

## 2016-06-13 DIAGNOSIS — E785 Hyperlipidemia, unspecified: Secondary | ICD-10-CM | POA: Diagnosis not present

## 2016-06-13 DIAGNOSIS — N189 Chronic kidney disease, unspecified: Secondary | ICD-10-CM | POA: Diagnosis not present

## 2016-06-13 DIAGNOSIS — E1122 Type 2 diabetes mellitus with diabetic chronic kidney disease: Secondary | ICD-10-CM | POA: Diagnosis not present

## 2016-06-13 DIAGNOSIS — I4891 Unspecified atrial fibrillation: Secondary | ICD-10-CM | POA: Diagnosis not present

## 2016-06-13 DIAGNOSIS — I129 Hypertensive chronic kidney disease with stage 1 through stage 4 chronic kidney disease, or unspecified chronic kidney disease: Secondary | ICD-10-CM | POA: Diagnosis not present

## 2016-06-13 DIAGNOSIS — Z5181 Encounter for therapeutic drug level monitoring: Secondary | ICD-10-CM | POA: Diagnosis not present

## 2016-06-17 DIAGNOSIS — D649 Anemia, unspecified: Secondary | ICD-10-CM | POA: Diagnosis not present

## 2016-06-17 DIAGNOSIS — I639 Cerebral infarction, unspecified: Secondary | ICD-10-CM | POA: Diagnosis not present

## 2016-06-17 DIAGNOSIS — E1165 Type 2 diabetes mellitus with hyperglycemia: Secondary | ICD-10-CM | POA: Diagnosis not present

## 2016-06-17 DIAGNOSIS — N189 Chronic kidney disease, unspecified: Secondary | ICD-10-CM | POA: Diagnosis not present

## 2016-06-17 DIAGNOSIS — I10 Essential (primary) hypertension: Secondary | ICD-10-CM | POA: Diagnosis not present

## 2016-06-17 DIAGNOSIS — Z7901 Long term (current) use of anticoagulants: Secondary | ICD-10-CM | POA: Diagnosis not present

## 2016-06-23 DIAGNOSIS — Z7901 Long term (current) use of anticoagulants: Secondary | ICD-10-CM | POA: Diagnosis not present

## 2016-06-30 DIAGNOSIS — Z7901 Long term (current) use of anticoagulants: Secondary | ICD-10-CM | POA: Diagnosis not present

## 2016-07-06 ENCOUNTER — Ambulatory Visit: Payer: Medicare Other | Admitting: Podiatry

## 2016-07-06 DIAGNOSIS — Z7901 Long term (current) use of anticoagulants: Secondary | ICD-10-CM | POA: Diagnosis not present

## 2016-07-13 ENCOUNTER — Encounter: Payer: Self-pay | Admitting: Podiatry

## 2016-07-13 ENCOUNTER — Ambulatory Visit (INDEPENDENT_AMBULATORY_CARE_PROVIDER_SITE_OTHER): Payer: Medicare Other | Admitting: Podiatry

## 2016-07-13 DIAGNOSIS — M79676 Pain in unspecified toe(s): Secondary | ICD-10-CM

## 2016-07-13 DIAGNOSIS — B351 Tinea unguium: Secondary | ICD-10-CM

## 2016-07-13 NOTE — Progress Notes (Signed)
Complaint:  Visit Type: Patient returns to my office for continued preventative foot care services. Complaint: Patient states" my nails have grown long and thick and become painful to walk and wear shoes" Patient has been diagnosed with DM with no foot complications. The patient presents for preventative foot care services. No changes to ROS  Podiatric Exam: Vascular: dorsalis pedis and posterior tibial pulses are barely  palpable bilateral. Capillary return is immediate. Temperature gradient is WNL. Skin turgor WNL  Sensorium: Normal Semmes Weinstein monofilament test. Normal tactile sensation bilaterally. Nail Exam: Pt has thick disfigured discolored nails with subungual debris noted bilateral entire nail hallux through fifth toenails Ulcer Exam: There is no evidence of ulcer or pre-ulcerative changes or infection. Orthopedic Exam: Muscle tone and strength are WNL. No limitations in general ROM. No crepitus or effusions noted. Foot type and digits show no abnormalities. Bony prominences are unremarkable. Skin: No Porokeratosis. No infection or ulcers  Diagnosis:  Onychomycosis, , Pain in right toe, pain in left toes  Treatment & Plan Procedures and Treatment: Consent by patient was obtained for treatment procedures. The patient understood the discussion of treatment and procedures well. All questions were answered thoroughly reviewed. Debridement of mycotic and hypertrophic toenails, 1 through 5 bilateral and clearing of subungual debris. No ulceration, no infection noted.  Return Visit-Office Procedure: Patient instructed to return to the office for a follow up visit 3 months for continued evaluation and treatment.    Gardiner Barefoot DPM

## 2016-07-14 DIAGNOSIS — D649 Anemia, unspecified: Secondary | ICD-10-CM | POA: Diagnosis not present

## 2016-07-14 DIAGNOSIS — Z6830 Body mass index (BMI) 30.0-30.9, adult: Secondary | ICD-10-CM | POA: Diagnosis not present

## 2016-07-14 DIAGNOSIS — E785 Hyperlipidemia, unspecified: Secondary | ICD-10-CM | POA: Diagnosis not present

## 2016-07-14 DIAGNOSIS — I639 Cerebral infarction, unspecified: Secondary | ICD-10-CM | POA: Diagnosis not present

## 2016-07-14 DIAGNOSIS — I4891 Unspecified atrial fibrillation: Secondary | ICD-10-CM | POA: Diagnosis not present

## 2016-07-14 DIAGNOSIS — Z7901 Long term (current) use of anticoagulants: Secondary | ICD-10-CM | POA: Diagnosis not present

## 2016-07-14 DIAGNOSIS — I129 Hypertensive chronic kidney disease with stage 1 through stage 4 chronic kidney disease, or unspecified chronic kidney disease: Secondary | ICD-10-CM | POA: Diagnosis not present

## 2016-07-14 DIAGNOSIS — E1129 Type 2 diabetes mellitus with other diabetic kidney complication: Secondary | ICD-10-CM | POA: Diagnosis not present

## 2016-07-14 DIAGNOSIS — E1165 Type 2 diabetes mellitus with hyperglycemia: Secondary | ICD-10-CM | POA: Diagnosis not present

## 2016-07-14 DIAGNOSIS — D631 Anemia in chronic kidney disease: Secondary | ICD-10-CM | POA: Diagnosis not present

## 2016-07-14 DIAGNOSIS — N183 Chronic kidney disease, stage 3 (moderate): Secondary | ICD-10-CM | POA: Diagnosis not present

## 2016-07-14 DIAGNOSIS — I10 Essential (primary) hypertension: Secondary | ICD-10-CM | POA: Diagnosis not present

## 2016-07-21 DIAGNOSIS — Z7901 Long term (current) use of anticoagulants: Secondary | ICD-10-CM | POA: Diagnosis not present

## 2016-07-28 DIAGNOSIS — Z7901 Long term (current) use of anticoagulants: Secondary | ICD-10-CM | POA: Diagnosis not present

## 2016-08-04 DIAGNOSIS — Z7901 Long term (current) use of anticoagulants: Secondary | ICD-10-CM | POA: Diagnosis not present

## 2016-08-09 DIAGNOSIS — Z7901 Long term (current) use of anticoagulants: Secondary | ICD-10-CM | POA: Diagnosis not present

## 2016-08-12 ENCOUNTER — Encounter (INDEPENDENT_AMBULATORY_CARE_PROVIDER_SITE_OTHER): Payer: Medicare Other | Admitting: Ophthalmology

## 2016-08-12 DIAGNOSIS — I10 Essential (primary) hypertension: Secondary | ICD-10-CM | POA: Diagnosis not present

## 2016-08-12 DIAGNOSIS — H43813 Vitreous degeneration, bilateral: Secondary | ICD-10-CM | POA: Diagnosis not present

## 2016-08-12 DIAGNOSIS — E113593 Type 2 diabetes mellitus with proliferative diabetic retinopathy without macular edema, bilateral: Secondary | ICD-10-CM

## 2016-08-12 DIAGNOSIS — H35033 Hypertensive retinopathy, bilateral: Secondary | ICD-10-CM | POA: Diagnosis not present

## 2016-08-12 DIAGNOSIS — E11319 Type 2 diabetes mellitus with unspecified diabetic retinopathy without macular edema: Secondary | ICD-10-CM

## 2016-08-17 DIAGNOSIS — Z7901 Long term (current) use of anticoagulants: Secondary | ICD-10-CM | POA: Diagnosis not present

## 2016-08-24 DIAGNOSIS — H539 Unspecified visual disturbance: Secondary | ICD-10-CM | POA: Diagnosis not present

## 2016-08-24 DIAGNOSIS — Z1211 Encounter for screening for malignant neoplasm of colon: Secondary | ICD-10-CM | POA: Diagnosis not present

## 2016-08-24 DIAGNOSIS — Z136 Encounter for screening for cardiovascular disorders: Secondary | ICD-10-CM | POA: Diagnosis not present

## 2016-08-24 DIAGNOSIS — Z0000 Encounter for general adult medical examination without abnormal findings: Secondary | ICD-10-CM | POA: Diagnosis not present

## 2016-08-24 DIAGNOSIS — Z1231 Encounter for screening mammogram for malignant neoplasm of breast: Secondary | ICD-10-CM | POA: Diagnosis not present

## 2016-08-24 DIAGNOSIS — Z13820 Encounter for screening for osteoporosis: Secondary | ICD-10-CM | POA: Diagnosis not present

## 2016-09-06 ENCOUNTER — Other Ambulatory Visit: Payer: Self-pay | Admitting: Internal Medicine

## 2016-09-06 DIAGNOSIS — Z1231 Encounter for screening mammogram for malignant neoplasm of breast: Secondary | ICD-10-CM

## 2016-09-06 DIAGNOSIS — E2839 Other primary ovarian failure: Secondary | ICD-10-CM

## 2016-09-07 DIAGNOSIS — Z0000 Encounter for general adult medical examination without abnormal findings: Secondary | ICD-10-CM | POA: Diagnosis not present

## 2016-09-07 DIAGNOSIS — Z136 Encounter for screening for cardiovascular disorders: Secondary | ICD-10-CM | POA: Diagnosis not present

## 2016-09-07 DIAGNOSIS — Z7901 Long term (current) use of anticoagulants: Secondary | ICD-10-CM | POA: Diagnosis not present

## 2016-09-08 ENCOUNTER — Encounter (INDEPENDENT_AMBULATORY_CARE_PROVIDER_SITE_OTHER): Payer: Medicare Other | Admitting: Ophthalmology

## 2016-09-08 DIAGNOSIS — E11319 Type 2 diabetes mellitus with unspecified diabetic retinopathy without macular edema: Secondary | ICD-10-CM | POA: Diagnosis not present

## 2016-09-08 DIAGNOSIS — E113593 Type 2 diabetes mellitus with proliferative diabetic retinopathy without macular edema, bilateral: Secondary | ICD-10-CM

## 2016-09-16 ENCOUNTER — Other Ambulatory Visit: Payer: Medicare Other

## 2016-09-16 ENCOUNTER — Ambulatory Visit: Payer: Medicare Other

## 2016-09-20 DIAGNOSIS — Z7901 Long term (current) use of anticoagulants: Secondary | ICD-10-CM | POA: Diagnosis not present

## 2016-09-20 DIAGNOSIS — I129 Hypertensive chronic kidney disease with stage 1 through stage 4 chronic kidney disease, or unspecified chronic kidney disease: Secondary | ICD-10-CM | POA: Diagnosis not present

## 2016-09-20 DIAGNOSIS — N183 Chronic kidney disease, stage 3 (moderate): Secondary | ICD-10-CM | POA: Diagnosis not present

## 2016-09-20 DIAGNOSIS — Z720 Tobacco use: Secondary | ICD-10-CM | POA: Diagnosis not present

## 2016-09-20 DIAGNOSIS — E1129 Type 2 diabetes mellitus with other diabetic kidney complication: Secondary | ICD-10-CM | POA: Diagnosis not present

## 2016-09-20 DIAGNOSIS — D631 Anemia in chronic kidney disease: Secondary | ICD-10-CM | POA: Diagnosis not present

## 2016-09-20 DIAGNOSIS — Z6830 Body mass index (BMI) 30.0-30.9, adult: Secondary | ICD-10-CM | POA: Diagnosis not present

## 2016-09-23 ENCOUNTER — Other Ambulatory Visit: Payer: Self-pay

## 2016-09-23 DIAGNOSIS — N186 End stage renal disease: Secondary | ICD-10-CM

## 2016-09-23 DIAGNOSIS — Z992 Dependence on renal dialysis: Principal | ICD-10-CM

## 2016-09-26 ENCOUNTER — Other Ambulatory Visit (INDEPENDENT_AMBULATORY_CARE_PROVIDER_SITE_OTHER): Payer: Medicare Other | Admitting: Ophthalmology

## 2016-09-26 DIAGNOSIS — E11311 Type 2 diabetes mellitus with unspecified diabetic retinopathy with macular edema: Secondary | ICD-10-CM

## 2016-09-26 DIAGNOSIS — E113592 Type 2 diabetes mellitus with proliferative diabetic retinopathy without macular edema, left eye: Secondary | ICD-10-CM

## 2016-09-27 ENCOUNTER — Other Ambulatory Visit: Payer: Self-pay | Admitting: Surgery

## 2016-09-27 DIAGNOSIS — Z01810 Encounter for preprocedural cardiovascular examination: Secondary | ICD-10-CM

## 2016-09-27 DIAGNOSIS — N185 Chronic kidney disease, stage 5: Secondary | ICD-10-CM

## 2016-09-30 DIAGNOSIS — Z7901 Long term (current) use of anticoagulants: Secondary | ICD-10-CM | POA: Diagnosis not present

## 2016-10-06 ENCOUNTER — Other Ambulatory Visit (INDEPENDENT_AMBULATORY_CARE_PROVIDER_SITE_OTHER): Payer: Medicare Other | Admitting: Ophthalmology

## 2016-10-06 DIAGNOSIS — E11311 Type 2 diabetes mellitus with unspecified diabetic retinopathy with macular edema: Secondary | ICD-10-CM

## 2016-10-06 DIAGNOSIS — I639 Cerebral infarction, unspecified: Secondary | ICD-10-CM | POA: Diagnosis not present

## 2016-10-06 DIAGNOSIS — F69 Unspecified disorder of adult personality and behavior: Secondary | ICD-10-CM | POA: Diagnosis not present

## 2016-10-06 DIAGNOSIS — E113591 Type 2 diabetes mellitus with proliferative diabetic retinopathy without macular edema, right eye: Secondary | ICD-10-CM | POA: Diagnosis not present

## 2016-10-06 DIAGNOSIS — Z7901 Long term (current) use of anticoagulants: Secondary | ICD-10-CM | POA: Diagnosis not present

## 2016-10-06 DIAGNOSIS — I10 Essential (primary) hypertension: Secondary | ICD-10-CM | POA: Diagnosis not present

## 2016-10-06 DIAGNOSIS — D649 Anemia, unspecified: Secondary | ICD-10-CM | POA: Diagnosis not present

## 2016-10-06 DIAGNOSIS — N189 Chronic kidney disease, unspecified: Secondary | ICD-10-CM | POA: Diagnosis not present

## 2016-10-06 DIAGNOSIS — E1165 Type 2 diabetes mellitus with hyperglycemia: Secondary | ICD-10-CM | POA: Diagnosis not present

## 2016-10-06 DIAGNOSIS — I4891 Unspecified atrial fibrillation: Secondary | ICD-10-CM | POA: Diagnosis not present

## 2016-10-06 DIAGNOSIS — E785 Hyperlipidemia, unspecified: Secondary | ICD-10-CM | POA: Diagnosis not present

## 2016-10-17 DIAGNOSIS — Z7901 Long term (current) use of anticoagulants: Secondary | ICD-10-CM | POA: Diagnosis not present

## 2016-10-18 ENCOUNTER — Ambulatory Visit (INDEPENDENT_AMBULATORY_CARE_PROVIDER_SITE_OTHER): Payer: Medicare Other | Admitting: Podiatry

## 2016-10-18 ENCOUNTER — Encounter: Payer: Self-pay | Admitting: Podiatry

## 2016-10-18 DIAGNOSIS — M79676 Pain in unspecified toe(s): Secondary | ICD-10-CM | POA: Diagnosis not present

## 2016-10-18 DIAGNOSIS — B351 Tinea unguium: Secondary | ICD-10-CM

## 2016-10-18 DIAGNOSIS — L608 Other nail disorders: Secondary | ICD-10-CM

## 2016-10-18 DIAGNOSIS — R0989 Other specified symptoms and signs involving the circulatory and respiratory systems: Secondary | ICD-10-CM

## 2016-10-18 NOTE — Progress Notes (Signed)
Complaint:  Visit Type: Patient returns to my office for continued preventative foot care services. Complaint: Patient states" my nails have grown long and thick and become painful to walk and wear shoes" Patient has been diagnosed with DM with no foot complications. The patient presents for preventative foot care services. No changes to ROS  Podiatric Exam: Vascular: dorsalis pedis and posterior tibial pulses are barely  palpable bilateral. Capillary return is immediate. Temperature gradient is WNL. Skin turgor WNL  Sensorium: Normal Semmes Weinstein monofilament test. Normal tactile sensation bilaterally. Nail Exam: Pt has thick disfigured discolored nails with subungual debris noted bilateral entire nail hallux through fifth toenails Ulcer Exam: There is no evidence of ulcer or pre-ulcerative changes or infection. Orthopedic Exam: Muscle tone and strength are WNL. No limitations in general ROM. No crepitus or effusions noted. Foot type and digits show no abnormalities. Bony prominences are unremarkable. Skin: No Porokeratosis. No infection or ulcers  Diagnosis:  Onychomycosis, , Pain in right toe, pain in left toes  Treatment & Plan Procedures and Treatment: Consent by patient was obtained for treatment procedures. The patient understood the discussion of treatment and procedures well. All questions were answered thoroughly reviewed. Debridement of mycotic and hypertrophic toenails, 1 through 5 bilateral and clearing of subungual debris. No ulceration, no infection noted.  Return Visit-Office Procedure: Patient instructed to return to the office for a follow up visit 3 months for continued evaluation and treatment.    Gardiner Barefoot DPM

## 2016-10-27 DIAGNOSIS — H109 Unspecified conjunctivitis: Secondary | ICD-10-CM | POA: Diagnosis not present

## 2016-10-27 DIAGNOSIS — I4891 Unspecified atrial fibrillation: Secondary | ICD-10-CM | POA: Diagnosis not present

## 2016-10-27 DIAGNOSIS — E1165 Type 2 diabetes mellitus with hyperglycemia: Secondary | ICD-10-CM | POA: Diagnosis not present

## 2016-10-27 DIAGNOSIS — Z7901 Long term (current) use of anticoagulants: Secondary | ICD-10-CM | POA: Diagnosis not present

## 2016-10-28 ENCOUNTER — Encounter: Payer: Self-pay | Admitting: Surgery

## 2016-11-07 ENCOUNTER — Other Ambulatory Visit (HOSPITAL_COMMUNITY): Payer: Medicare Other

## 2016-11-07 ENCOUNTER — Encounter: Payer: Medicare Other | Admitting: Surgery

## 2016-11-07 ENCOUNTER — Inpatient Hospital Stay (HOSPITAL_COMMUNITY): Admission: RE | Admit: 2016-11-07 | Payer: Medicare Other | Source: Ambulatory Visit

## 2016-11-16 DIAGNOSIS — Z7901 Long term (current) use of anticoagulants: Secondary | ICD-10-CM | POA: Diagnosis not present

## 2016-11-17 DIAGNOSIS — Z720 Tobacco use: Secondary | ICD-10-CM | POA: Diagnosis not present

## 2016-11-17 DIAGNOSIS — D649 Anemia, unspecified: Secondary | ICD-10-CM | POA: Diagnosis not present

## 2016-11-17 DIAGNOSIS — Z6827 Body mass index (BMI) 27.0-27.9, adult: Secondary | ICD-10-CM | POA: Diagnosis not present

## 2016-11-17 DIAGNOSIS — E1129 Type 2 diabetes mellitus with other diabetic kidney complication: Secondary | ICD-10-CM | POA: Diagnosis not present

## 2016-11-17 DIAGNOSIS — I129 Hypertensive chronic kidney disease with stage 1 through stage 4 chronic kidney disease, or unspecified chronic kidney disease: Secondary | ICD-10-CM | POA: Diagnosis not present

## 2016-11-17 DIAGNOSIS — N183 Chronic kidney disease, stage 3 (moderate): Secondary | ICD-10-CM | POA: Diagnosis not present

## 2016-12-07 DIAGNOSIS — Z7901 Long term (current) use of anticoagulants: Secondary | ICD-10-CM | POA: Diagnosis not present

## 2016-12-21 DIAGNOSIS — Z7901 Long term (current) use of anticoagulants: Secondary | ICD-10-CM | POA: Diagnosis not present

## 2016-12-21 DIAGNOSIS — I10 Essential (primary) hypertension: Secondary | ICD-10-CM | POA: Diagnosis not present

## 2016-12-21 DIAGNOSIS — B353 Tinea pedis: Secondary | ICD-10-CM | POA: Diagnosis not present

## 2016-12-21 DIAGNOSIS — N184 Chronic kidney disease, stage 4 (severe): Secondary | ICD-10-CM | POA: Diagnosis not present

## 2016-12-21 DIAGNOSIS — E1165 Type 2 diabetes mellitus with hyperglycemia: Secondary | ICD-10-CM | POA: Diagnosis not present

## 2016-12-21 DIAGNOSIS — E113593 Type 2 diabetes mellitus with proliferative diabetic retinopathy without macular edema, bilateral: Secondary | ICD-10-CM | POA: Diagnosis not present

## 2017-01-04 ENCOUNTER — Ambulatory Visit (INDEPENDENT_AMBULATORY_CARE_PROVIDER_SITE_OTHER): Payer: Medicare Other | Admitting: Vascular Surgery

## 2017-01-04 ENCOUNTER — Ambulatory Visit (INDEPENDENT_AMBULATORY_CARE_PROVIDER_SITE_OTHER)
Admission: RE | Admit: 2017-01-04 | Discharge: 2017-01-04 | Disposition: A | Discharge status: Home / Self Care | Payer: Medicare Other | Source: Ambulatory Visit | Attending: Vascular Surgery | Admitting: Vascular Surgery

## 2017-01-04 ENCOUNTER — Encounter: Payer: Self-pay | Admitting: Vascular Surgery

## 2017-01-04 ENCOUNTER — Telehealth: Payer: Self-pay | Admitting: *Deleted

## 2017-01-04 ENCOUNTER — Encounter: Payer: Self-pay | Admitting: *Deleted

## 2017-01-04 ENCOUNTER — Other Ambulatory Visit: Payer: Self-pay

## 2017-01-04 ENCOUNTER — Ambulatory Visit (HOSPITAL_COMMUNITY)
Admission: RE | Admit: 2017-01-04 | Discharge: 2017-01-04 | Disposition: A | Discharge status: Home / Self Care | Payer: Medicare Other | Source: Ambulatory Visit | Attending: Vascular Surgery | Admitting: Vascular Surgery

## 2017-01-04 VITALS — BP 158/77 | HR 60 | Temp 97.6°F | Ht 67.0 in | Wt 172.0 lb

## 2017-01-04 DIAGNOSIS — Z992 Dependence on renal dialysis: Secondary | ICD-10-CM

## 2017-01-04 DIAGNOSIS — N185 Chronic kidney disease, stage 5: Secondary | ICD-10-CM

## 2017-01-04 DIAGNOSIS — N186 End stage renal disease: Secondary | ICD-10-CM | POA: Diagnosis not present

## 2017-01-04 DIAGNOSIS — Z7901 Long term (current) use of anticoagulants: Secondary | ICD-10-CM | POA: Diagnosis not present

## 2017-01-04 NOTE — Progress Notes (Signed)
Patient name: Tracy Elliott MRN: [id number] DOB: [birth year] Sex: female   REASON FOR CONSULT:    To evaluate for hemodialysis access. The consult is requested by Dr. Moshe Cipro  HPI:   Kynnadi Dicenso is a pleasant 71 y.o. female,  Who was referred for evaluation for hemodialysis access. She is right-handed. She had a stroke in [place] that was associated with right upper extremity and right lower extremity weakness this has improved significantly. She just moved to [place]. She denies any recent uremic symptoms. Specifically, she denies nausea, vomiting, fatigue, anorexia, or palpitations.  I have reviewed the records that were sent from [facility]. The patient relocated from [place] after a stroke so that she could be with her family. She has poorly controlled diabetes and her most recent A1c was 10. She also has a long-standing history of hypertension. Labs on 09/20/2016 showed a creatinine of 3.5 with a GFR of 15.  Past Medical History:  Diagnosis Date  . Anemia   . Asthma    in the past  . Atrial fibrillation ([place])   . Chronic kidney disease   . Diabetes mellitus without complication ([place])    type 2  . Dysphagia   . GERD (gastroesophageal reflux disease)   . Hypertension   . Stroke ([place]) 12/2015   some right sided weakness, decreased vision on right    Family History  Problem Relation Age of Onset  . Diabetes Mother   . Asthma Mother   . Atrial fibrillation Mother   . Alzheimer's disease Mother   . Heart disease Father   . Hypertension Father   . Peripheral vascular disease Father     SOCIAL HISTORY: Social History   Social History  . Marital status: Widowed    Spouse name: N/A  . Number of children: N/A  . Years of education: N/A   Occupational History  . Not on file.   Social History Main Topics  . Smoking status: Former Smoker    Types: Cigarettes    Quit date: 05/31/1992  . Smokeless tobacco: Current User    Types: Snuff  .  Alcohol use No  . Drug use: No  . Sexual activity: Not on file   Other Topics Concern  . Not on file   Social History Narrative  . No narrative on file    Allergies  Allergen Reactions  . Morphine   . Morphine And Related Other (See Comments)    confusion  . Oxycodone Other (See Comments)    confusion  . Penicillins     Current Outpatient Prescriptions  Medication Sig Dispense Refill  . aspirin 81 MG EC tablet Take 81 mg by mouth daily.  0  . atorvastatin (LIPITOR) 80 MG tablet Take 80 mg by mouth at bedtime.  0  . BD ULTRA-FINE PEN NEEDLES 29G X 12.7MM MISC USE BEFORE MEALS AND AT BEDTIME  0  . Blood Glucose Monitoring Suppl (ONE TOUCH ULTRA 2) w/Device KIT **SIGN AOB USE TO TEST BLOOD SUGAR FOUR TIMES DAILY  0  . carvedilol (COREG) 25 MG tablet Take 25 mg by mouth 2 (two) times daily. Takes 1/2 of 25 mg pill two times a day  0  . clotrimazole (LOTRIMIN) 1 % cream APPLY TO AFFECTED & SURROUNDING AREAS OF SKIN 2 TIMES DAILY IN THE MORNING AND EVENING  3  . diltiazem (CARDIZEM CD) 180 MG 24 hr capsule Take 180 mg by mouth daily.  0  . FERREX 150 150 MG capsule  TAKE 1 CAPSULE BY ORAL ROUTE AT BEDTIME.  2  . HUMALOG KWIKPEN 100 UNIT/ML KiwkPen See admin instructions.  0  . hydrALAZINE (APRESOLINE) 50 MG tablet Take 50 mg by mouth 3 (three) times daily.  0  . Insulin Glargine (BASAGLAR KWIKPEN) 100 UNIT/ML SOPN See admin instructions.  0  . Lancets (ONETOUCH ULTRASOFT) lancets **SIGN AOB ** USE TO TEST BLOOD SUGAR BEFORE MEALS AT BEDTIME  0  . ONE TOUCH ULTRA TEST test strip TAKE 1 STRIP(S) 4 TIMES A DAY DX CODE E11.65  3  . pantoprazole (PROTONIX) 40 MG tablet Take 40 mg by mouth daily.  0  . warfarin (COUMADIN) 1 MG tablet warfarin 1 mg tablet  use as directed ; 5mg + 2mg = 7mg on M/W/F alternate with 5mg + 3mg = 8mg on Tues/ Thurs/Sat/Sun    . furosemide (LASIX) 40 MG tablet furosemide 40 mg tablet  1 bid     No current facility-administered medications for this visit.      REVIEW OF SYSTEMS:  [X] denotes positive finding, [ ] denotes negative finding Cardiac  Comments:  Chest pain or chest pressure:    Shortness of breath upon exertion:    Short of breath when lying flat:    Irregular heart rhythm:        Vascular    Pain in calf, thigh, or hip brought on by ambulation:    Pain in feet at night that wakes you up from your sleep:     Blood clot in your veins:    Leg swelling:         Pulmonary    Oxygen at home:    Productive cough:     Wheezing:         Neurologic    Sudden weakness in arms or legs:  X Right arm and leg   Sudden numbness in arms or legs:  X   Sudden onset of difficulty speaking or slurred speech:    Temporary loss of vision in one eye:     Problems with dizziness:         Gastrointestinal    Blood in stool:     Vomited blood:         Genitourinary    Burning when urinating:     Blood in urine:        Psychiatric    Major depression:         Hematologic    Bleeding problems:    Problems with blood clotting too easily:        Skin    Rashes or ulcers:        Constitutional    Fever or chills:     PHYSICAL EXAM:   Vitals:   01/04/17 0942  BP: (!) 158/77  Pulse: 60  Temp: 97.6 F (36.4 C)  TempSrc: Oral  SpO2: 100%  Weight: 172 lb (78 kg)  Height: 5' 7" (1.702 m)    GENERAL: The patient is a well-nourished female, in no acute distress. The vital signs are documented above. CARDIAC: There is a regular rate and rhythm.  VASCULAR: I do not detect carotid bruits. She has palpable radial and brachial pulses bilaterally. She has no significant lower extremity swelling. PULMONARY: There is good air exchange bilaterally without wheezing or rales. ABDOMEN: Soft and non-tender with normal pitched bowel sounds.  MUSCULOSKELETAL: There are no major deformities or cyanosis. NEUROLOGIC: No focal weakness or paresthesias are detected. SKIN: There are no ulcers or   rashes noted. PSYCHIATRIC: The patient has a  normal affect.  DATA:    Arterial duplex upper extremity: I have independently interpreted her upper extremity arterial duplex.  On the right side there is a triphasic radial and ulnar signal. The brachial artery is 0.52 cm in diameter.   On the left side, there is a triphasic radial and ulnar signal. The brachial artery measures 0.49 cm in diameter.  UPPER EXTREMITY VEIN MAP: I have been thoroughly interpreted her upper extremity vein map.  On the right side the forearm cephalic vein is small. The upper arm cephalic vein is marginal in size. The basilic vein looks reasonable.  On the left side, the forearm cephalic vein is small. The upper arm cephalic vein is small. The basilic vein is reasonable.   MEDICAL ISSUES:   STAGE V CHRONIC KIDNEY DISEASE: Based on her vein map, but appears that her best option for a fistula would be a right brachiocephalic AV fistula. If this were not adequate and she could potentially have a first stage basilic vein transposition. If neither were adequate she would require placement of an AV graft. Although she is right-handed the veins in the left arm are smaller and she does not appear to have good options on the left side. I have discussed the indications for surgery. We have also discussed the potential complications of surgery. These risks include wound healing problems, infection, swelling, steal symptoms, failure of the fistula to mature or, the need for revision of the fistula, or other on vertical medical problems. In addition if we do a basilic vein transposition I think this should be done in 2 stages. We have discussed this in detail. Her surgery has been scheduled for 01/20/2017. All of her questions were answered and she is agreeable to proceed.  HYPERTENSION: The patient's initial blood pressure today was elevated. We repeated this and this was still elevated. We have encouraged the patient to follow up with their primary care physician for management  of their blood pressure.  CHRONIC COUMADIN THERAPY: She is on Coumadin because of her previous stroke. This will be held 4 days prior to her procedure.  Deitra Mayo [facility] [phone]

## 2017-01-04 NOTE — Telephone Encounter (Signed)
Call to Dr. Maia Petties. OK to hold Coumadin 4 days prior to procedure per Dr. Maia Petties.

## 2017-01-17 ENCOUNTER — Encounter (HOSPITAL_COMMUNITY): Payer: Self-pay | Admitting: *Deleted

## 2017-01-17 NOTE — Progress Notes (Addendum)
Pt SDW-pre-op call completed by pt niece, Nadyne Coombes (DPR). Nadyne Coombes denies that pt C/O SOB and chest pain. Nadyne Coombes denies that is under the care of a [facility] denies that pt had a stress test, echo and cardiac cath. Nadyne Coombes denies that pt had a chest x ray and EKG within the last year. Nadyne Coombes stated that last dose of Coumadin was Saturday, 01/14/17. Nadyne Coombes made aware to have pt stop taking vitamins, fish oil  and herbal medications. Do not take any NSAIDs ie: Ibuprofen, Advil, Naproxen (Aleve), Motrin, BC and Goody Powder. Nadyne Coombes stated that MD advised that pt take half of Basaglar on DOS but no Humalog insulin. Nadyne Coombes made aware to check BG every 2 hours prior to arrival to hospital on DOS. Pt made aware to treat a BG < 70 with  4 ounces of apple or cranberry juice, wait 15 minutes after intervention to recheck BG, if BG remains < 70, call Short Stay unit to speak with a nurse. Shaina verbalized understanding of all pre-op instructions.

## 2017-01-18 ENCOUNTER — Ambulatory Visit (INDEPENDENT_AMBULATORY_CARE_PROVIDER_SITE_OTHER): Payer: Medicare Other | Admitting: Podiatry

## 2017-01-18 DIAGNOSIS — M79676 Pain in unspecified toe(s): Secondary | ICD-10-CM

## 2017-01-18 DIAGNOSIS — D689 Coagulation defect, unspecified: Secondary | ICD-10-CM

## 2017-01-18 DIAGNOSIS — L608 Other nail disorders: Secondary | ICD-10-CM

## 2017-01-18 DIAGNOSIS — B351 Tinea unguium: Secondary | ICD-10-CM

## 2017-01-18 NOTE — Progress Notes (Signed)
Complaint:  Visit Type: Patient returns to my office for continued preventative foot care services. Complaint: Patient states" my nails have grown long and thick and become painful to walk and wear shoes" Patient has been diagnosed with DM with no foot complications. The patient presents for preventative foot care services. No changes to ROS  Podiatric Exam: Vascular: dorsalis pedis and posterior tibial pulses are barely  palpable bilateral. Capillary return is immediate. Temperature gradient is WNL. Skin turgor WNL  Sensorium: Normal Semmes Weinstein monofilament test. Normal tactile sensation bilaterally. Nail Exam: Pt has thick disfigured discolored nails with subungual debris noted bilateral entire nail hallux through fifth toenails Ulcer Exam: There is no evidence of ulcer or pre-ulcerative changes or infection. Orthopedic Exam: Muscle tone and strength are WNL. No limitations in general ROM. No crepitus or effusions noted. Foot type and digits show no abnormalities. Bony prominences are unremarkable. Skin: No Porokeratosis. No infection or ulcers  Diagnosis:  Onychomycosis, , Pain in right toe, pain in left toes  Treatment & Plan Procedures and Treatment: Consent by patient was obtained for treatment procedures. The patient understood the discussion of treatment and procedures well. All questions were answered thoroughly reviewed. Debridement of mycotic and hypertrophic toenails, 1 through 5 bilateral and clearing of subungual debris. No ulceration, no infection noted.  Return Visit-Office Procedure: Patient instructed to return to the office for a follow up visit 3 months for continued evaluation and treatment.    Gardiner Barefoot DPM

## 2017-01-19 DIAGNOSIS — E113599 Type 2 diabetes mellitus with proliferative diabetic retinopathy without macular edema, unspecified eye: Secondary | ICD-10-CM | POA: Insufficient documentation

## 2017-01-19 DIAGNOSIS — Z8669 Personal history of other diseases of the nervous system and sense organs: Secondary | ICD-10-CM | POA: Insufficient documentation

## 2017-01-19 DIAGNOSIS — Z8639 Personal history of other endocrine, nutritional and metabolic disease: Secondary | ICD-10-CM | POA: Insufficient documentation

## 2017-01-19 NOTE — Progress Notes (Signed)
Anesthesia Chart Review:  Pt is a same day work up  Pt is a 71 year old female scheduled for AV fistula creation vs insertion of AV graft on 01/20/2017 with Deitra Mayo, MD  - PCP is Latanya Presser, MD who is aware of upcoming procedure, last office visit 12/21/16 - Nephrologist is Corliss Parish, MD  PMH includes:   Stroke, hx afib, HTN, DM, asthma, anemia, CKD (stage V- not yet on dialysis), GERD. Former smoker.   Medications include: ASA 81 mg, Lipitor, carvedilol, diltiazem, Lasix, hydralazine, basaglar kwikpen, iron, protonix, coumadin. Pt to hold coumadin 4 days before procedure.   Labs will be obtained DOS  EKG 03/18/16: sinus bradycardia (45 bpm). RBBB.   If labs acceptable DOS, I anticipate pt can proceed with surgery as scheduled.   Willeen Cass, FNP-BC [facility] Short Stay Surgical Center/Anesthesiology Phone: [phone] 01/19/2017 4:07 PM

## 2017-01-20 ENCOUNTER — Ambulatory Visit (HOSPITAL_COMMUNITY)
Admission: RE | Admit: 2017-01-20 | Discharge: 2017-01-20 | Disposition: A | Discharge status: Home / Self Care | Payer: Medicare Other | Source: Ambulatory Visit | Attending: Vascular Surgery | Admitting: Vascular Surgery

## 2017-01-20 ENCOUNTER — Encounter (HOSPITAL_COMMUNITY)
Admission: RE | Disposition: A | Discharge status: Home / Self Care | Payer: Self-pay | Source: Ambulatory Visit | Attending: Vascular Surgery

## 2017-01-20 ENCOUNTER — Ambulatory Visit (HOSPITAL_COMMUNITY): Payer: Medicare Other | Admitting: Emergency Medicine

## 2017-01-20 DIAGNOSIS — Z794 Long term (current) use of insulin: Secondary | ICD-10-CM | POA: Diagnosis not present

## 2017-01-20 DIAGNOSIS — Z79899 Other long term (current) drug therapy: Secondary | ICD-10-CM | POA: Diagnosis not present

## 2017-01-20 DIAGNOSIS — Z880 Allergy status to penicillin: Secondary | ICD-10-CM | POA: Diagnosis not present

## 2017-01-20 DIAGNOSIS — I129 Hypertensive chronic kidney disease with stage 1 through stage 4 chronic kidney disease, or unspecified chronic kidney disease: Secondary | ICD-10-CM | POA: Insufficient documentation

## 2017-01-20 DIAGNOSIS — E1122 Type 2 diabetes mellitus with diabetic chronic kidney disease: Secondary | ICD-10-CM | POA: Insufficient documentation

## 2017-01-20 DIAGNOSIS — I4891 Unspecified atrial fibrillation: Secondary | ICD-10-CM | POA: Insufficient documentation

## 2017-01-20 DIAGNOSIS — Z87891 Personal history of nicotine dependence: Secondary | ICD-10-CM | POA: Diagnosis not present

## 2017-01-20 DIAGNOSIS — D509 Iron deficiency anemia, unspecified: Secondary | ICD-10-CM | POA: Diagnosis not present

## 2017-01-20 DIAGNOSIS — Z8673 Personal history of transient ischemic attack (TIA), and cerebral infarction without residual deficits: Secondary | ICD-10-CM | POA: Diagnosis not present

## 2017-01-20 DIAGNOSIS — J45909 Unspecified asthma, uncomplicated: Secondary | ICD-10-CM | POA: Diagnosis not present

## 2017-01-20 DIAGNOSIS — K219 Gastro-esophageal reflux disease without esophagitis: Secondary | ICD-10-CM | POA: Insufficient documentation

## 2017-01-20 DIAGNOSIS — Z7901 Long term (current) use of anticoagulants: Secondary | ICD-10-CM | POA: Diagnosis not present

## 2017-01-20 DIAGNOSIS — N184 Chronic kidney disease, stage 4 (severe): Secondary | ICD-10-CM | POA: Insufficient documentation

## 2017-01-20 DIAGNOSIS — N185 Chronic kidney disease, stage 5: Secondary | ICD-10-CM | POA: Diagnosis not present

## 2017-01-20 DIAGNOSIS — Z7982 Long term (current) use of aspirin: Secondary | ICD-10-CM | POA: Diagnosis not present

## 2017-01-20 HISTORY — PX: AV FISTULA PLACEMENT: SHX1204

## 2017-01-20 LAB — POTASSIUM: Potassium: 3.7 mmol/L (ref 3.5–5.1)

## 2017-01-20 LAB — POCT I-STAT 4, (NA,K, GLUC, HGB,HCT)
Glucose, Bld: 140 mg/dL — ABNORMAL HIGH (ref 65–99)
HCT: 35 % — ABNORMAL LOW (ref 36.0–46.0)
HEMOGLOBIN: 11.9 g/dL — AB (ref 12.0–15.0)
Potassium: 7.4 mmol/L (ref 3.5–5.1)
Sodium: 137 mmol/L (ref 135–145)

## 2017-01-20 LAB — PROTIME-INR
INR: 1.17
PROTHROMBIN TIME: 14.8 s (ref 11.4–15.2)

## 2017-01-20 LAB — GLUCOSE, CAPILLARY: Glucose-Capillary: 152 mg/dL — ABNORMAL HIGH (ref 65–99)

## 2017-01-20 SURGERY — ARTERIOVENOUS (AV) FISTULA CREATION
Anesthesia: Monitor Anesthesia Care | Site: Arm Upper | Laterality: Right

## 2017-01-20 MED ORDER — FENTANYL CITRATE (PF) 100 MCG/2ML IJ SOLN: 25.0000 ug | INTRAMUSCULAR | Status: DC | PRN

## 2017-01-20 MED ORDER — LIDOCAINE 2% (20 MG/ML) 5 ML SYRINGE
INTRAMUSCULAR | Status: AC
  Filled 2017-01-20: qty 5

## 2017-01-20 MED ORDER — SODIUM CHLORIDE 0.9 % IV SOLN
INTRAVENOUS | Status: DC | PRN
  Administered 2017-01-20: 13:00:00 via INTRAVENOUS

## 2017-01-20 MED ORDER — VANCOMYCIN HCL IN DEXTROSE 1-5 GM/200ML-% IV SOLN
INTRAVENOUS | Status: AC
  Filled 2017-01-20: qty 200

## 2017-01-20 MED ORDER — PAPAVERINE HCL 30 MG/ML IJ SOLN
INTRAMUSCULAR | Status: AC
  Filled 2017-01-20: qty 2

## 2017-01-20 MED ORDER — 0.9 % SODIUM CHLORIDE (POUR BTL) OPTIME
TOPICAL | Status: DC | PRN
  Administered 2017-01-20: 1000 mL

## 2017-01-20 MED ORDER — PROTAMINE SULFATE 10 MG/ML IV SOLN
INTRAVENOUS | Status: AC
  Filled 2017-01-20: qty 15

## 2017-01-20 MED ORDER — MIDAZOLAM HCL 2 MG/2ML IJ SOLN
INTRAMUSCULAR | Status: AC
  Filled 2017-01-20: qty 2

## 2017-01-20 MED ORDER — SODIUM CHLORIDE 0.9 % IV SOLN
INTRAVENOUS | Status: DC
  Administered 2017-01-20: 11:00:00 via INTRAVENOUS

## 2017-01-20 MED ORDER — MIDAZOLAM HCL 5 MG/5ML IJ SOLN
INTRAMUSCULAR | Status: DC | PRN
  Administered 2017-01-20 (×2): 1 mg via INTRAVENOUS

## 2017-01-20 MED ORDER — PAPAVERINE HCL 30 MG/ML IJ SOLN
INTRAMUSCULAR | Status: DC | PRN
  Administered 2017-01-20: 60 mg

## 2017-01-20 MED ORDER — TRAMADOL HCL 50 MG PO TABS: 50.0000 mg | ORAL_TABLET | Freq: Four times a day (QID) | ORAL | 0 refills | Status: DC | PRN

## 2017-01-20 MED ORDER — HEPARIN SODIUM (PORCINE) 1000 UNIT/ML IJ SOLN
INTRAMUSCULAR | Status: AC
  Filled 2017-01-20: qty 3

## 2017-01-20 MED ORDER — PROTAMINE SULFATE 10 MG/ML IV SOLN
INTRAVENOUS | Status: DC | PRN
  Administered 2017-01-20: 40 mg via INTRAVENOUS

## 2017-01-20 MED ORDER — VANCOMYCIN HCL IN DEXTROSE 1-5 GM/200ML-% IV SOLN
1000.0000 mg | INTRAVENOUS | Status: AC
  Administered 2017-01-20: 1000 mg via INTRAVENOUS

## 2017-01-20 MED ORDER — DEXAMETHASONE SODIUM PHOSPHATE 10 MG/ML IJ SOLN
INTRAMUSCULAR | Status: AC
  Filled 2017-01-20: qty 1

## 2017-01-20 MED ORDER — SODIUM CHLORIDE 0.9 % IV SOLN
INTRAVENOUS | Status: DC | PRN
  Administered 2017-01-20: 13:00:00

## 2017-01-20 MED ORDER — CHLORHEXIDINE GLUCONATE CLOTH 2 % EX PADS: 6.0000 | MEDICATED_PAD | Freq: Once | CUTANEOUS | Status: DC

## 2017-01-20 MED ORDER — PROPOFOL 500 MG/50ML IV EMUL
INTRAVENOUS | Status: DC | PRN
  Administered 2017-01-20: 30 ug/kg/min via INTRAVENOUS

## 2017-01-20 MED ORDER — HEPARIN SODIUM (PORCINE) 1000 UNIT/ML IJ SOLN
INTRAMUSCULAR | Status: DC | PRN
  Administered 2017-01-20: 7000 [IU] via INTRAVENOUS

## 2017-01-20 MED ORDER — LIDOCAINE-EPINEPHRINE (PF) 1 %-1:200000 IJ SOLN
INTRAMUSCULAR | Status: AC
  Filled 2017-01-20: qty 30

## 2017-01-20 MED ORDER — FENTANYL CITRATE (PF) 100 MCG/2ML IJ SOLN
INTRAMUSCULAR | Status: DC | PRN
  Administered 2017-01-20 (×2): 25 ug via INTRAVENOUS

## 2017-01-20 MED ORDER — PHENYLEPHRINE HCL 10 MG/ML IJ SOLN
INTRAVENOUS | Status: DC | PRN
  Administered 2017-01-20: 15 ug/min via INTRAVENOUS

## 2017-01-20 MED ORDER — LIDOCAINE HCL (PF) 1 % IJ SOLN
INTRAMUSCULAR | Status: DC | PRN
  Administered 2017-01-20: 30 mL

## 2017-01-20 MED ORDER — FENTANYL CITRATE (PF) 250 MCG/5ML IJ SOLN
INTRAMUSCULAR | Status: AC
  Filled 2017-01-20: qty 5

## 2017-01-20 MED ORDER — ONDANSETRON HCL 4 MG/2ML IJ SOLN: 4.0000 mg | Freq: Once | INTRAMUSCULAR | Status: DC | PRN

## 2017-01-20 MED ORDER — LIDOCAINE HCL (PF) 1 % IJ SOLN
INTRAMUSCULAR | Status: AC
  Filled 2017-01-20: qty 30

## 2017-01-20 SURGICAL SUPPLY — 40 items
ARMBAND PINK RESTRICT EXTREMIT (MISCELLANEOUS) ×4 IMPLANT
CANISTER SUCT 3000ML PPV (MISCELLANEOUS) ×2 IMPLANT
CANNULA VESSEL 3MM 2 BLNT TIP (CANNULA) ×4 IMPLANT
CLIP VESOCCLUDE MED 6/CT (CLIP) ×2 IMPLANT
CLIP VESOCCLUDE SM WIDE 6/CT (CLIP) ×2 IMPLANT
COVER PROBE W GEL 5X96 (DRAPES) ×2 IMPLANT
DECANTER SPIKE VIAL GLASS SM (MISCELLANEOUS) ×2 IMPLANT
DERMABOND ADVANCED (GAUZE/BANDAGES/DRESSINGS) ×1
DERMABOND ADVANCED .7 DNX12 (GAUZE/BANDAGES/DRESSINGS) ×1 IMPLANT
ELECT REM PT RETURN 9FT ADLT (ELECTROSURGICAL) ×2
ELECTRODE REM PT RTRN 9FT ADLT (ELECTROSURGICAL) ×1 IMPLANT
GLOVE BIO SURGEON STRL SZ 6.5 (GLOVE) ×2 IMPLANT
GLOVE BIO SURGEON STRL SZ7.5 (GLOVE) ×2 IMPLANT
GLOVE BIOGEL PI IND STRL 6.5 (GLOVE) ×1 IMPLANT
GLOVE BIOGEL PI IND STRL 7.0 (GLOVE) ×1 IMPLANT
GLOVE BIOGEL PI IND STRL 8 (GLOVE) ×1 IMPLANT
GLOVE BIOGEL PI IND STRL 8.5 (GLOVE) ×1 IMPLANT
GLOVE BIOGEL PI INDICATOR 6.5 (GLOVE) ×1
GLOVE BIOGEL PI INDICATOR 7.0 (GLOVE) ×1
GLOVE BIOGEL PI INDICATOR 8 (GLOVE) ×1
GLOVE BIOGEL PI INDICATOR 8.5 (GLOVE) ×1
GLOVE SS BIOGEL STRL SZ 8 (GLOVE) ×1 IMPLANT
GLOVE SUPERSENSE BIOGEL SZ 8 (GLOVE) ×1
GOWN STRL REUS W/ TWL LRG LVL3 (GOWN DISPOSABLE) ×3 IMPLANT
GOWN STRL REUS W/TWL LRG LVL3 (GOWN DISPOSABLE) ×3
KIT BASIN OR (CUSTOM PROCEDURE TRAY) ×2 IMPLANT
KIT ROOM TURNOVER OR (KITS) ×2 IMPLANT
NEEDLE 18GX1X1/2 (RX/OR ONLY) (NEEDLE) ×2 IMPLANT
NS IRRIG 1000ML POUR BTL (IV SOLUTION) ×2 IMPLANT
PACK CV ACCESS (CUSTOM PROCEDURE TRAY) ×2 IMPLANT
PAD ARMBOARD 7.5X6 YLW CONV (MISCELLANEOUS) ×4 IMPLANT
SPONGE SURGIFOAM ABS GEL 100 (HEMOSTASIS) IMPLANT
SUT PROLENE 6 0 BV (SUTURE) ×4 IMPLANT
SUT VIC AB 3-0 SH 27 (SUTURE) ×1
SUT VIC AB 3-0 SH 27X BRD (SUTURE) ×1 IMPLANT
SUT VICRYL 4-0 PS2 18IN ABS (SUTURE) ×2 IMPLANT
SYR 20CC LL (SYRINGE) ×2 IMPLANT
SYR 3ML LL SCALE MARK (SYRINGE) ×2 IMPLANT
UNDERPAD 30X30 (UNDERPADS AND DIAPERS) ×2 IMPLANT
WATER STERILE IRR 1000ML POUR (IV SOLUTION) ×2 IMPLANT

## 2017-01-20 NOTE — Interval H&P Note (Signed)
History and Physical Interval Note:  01/20/2017 12:36 PM  Tracy Elliott  has presented today for surgery, with the diagnosis of Chronic Kidney Failure Stage 5  N18.5  The various methods of treatment have been discussed with the patient and family. After consideration of risks, benefits and other options for treatment, the patient has consented to  Procedure(s): ARTERIOVENOUS (AV) FISTULA CREATION VERSUS INSERTION OF ARTERIOVENOUS GRAFT (Right) as a surgical intervention .  The patient's history has been reviewed, patient examined, no change in status, stable for surgery.  I have reviewed the patient's chart and labs.  Questions were answered to the patient's satisfaction.     Deitra Mayo

## 2017-01-20 NOTE — H&P (View-Only) (Signed)
Patient name: Tracy Elliott MRN: [id number] DOB: [birth year] Sex: female   REASON FOR CONSULT:    To evaluate for hemodialysis access. The consult is requested by Dr. Moshe Cipro  HPI:   Tracy Elliott is a pleasant 71 y.o. female,  Who was referred for evaluation for hemodialysis access. She is right-handed. She had a stroke in [place] that was associated with right upper extremity and right lower extremity weakness this has improved significantly. She just moved to [place]. She denies any recent uremic symptoms. Specifically, she denies nausea, vomiting, fatigue, anorexia, or palpitations.  I have reviewed the records that were sent from [facility]. The patient relocated from [place] after a stroke so that she could be with her family. She has poorly controlled diabetes and her most recent A1c was 10. She also has a long-standing history of hypertension. Labs on 09/20/2016 showed a creatinine of 3.5 with a GFR of 15.  Past Medical History:  Diagnosis Date  . Anemia   . Asthma    in the past  . Atrial fibrillation ([place])   . Chronic kidney disease   . Diabetes mellitus without complication ([place])    type 2  . Dysphagia   . GERD (gastroesophageal reflux disease)   . Hypertension   . Stroke ([place]) 12/2015   some right sided weakness, decreased vision on right    Family History  Problem Relation Age of Onset  . Diabetes Mother   . Asthma Mother   . Atrial fibrillation Mother   . Alzheimer's disease Mother   . Heart disease Father   . Hypertension Father   . Peripheral vascular disease Father     SOCIAL HISTORY: Social History   Social History  . Marital status: Widowed    Spouse name: N/A  . Number of children: N/A  . Years of education: N/A   Occupational History  . Not on file.   Social History Main Topics  . Smoking status: Former Smoker    Types: Cigarettes    Quit date: 05/31/1992  . Smokeless tobacco: Current User    Types: Snuff  .  Alcohol use No  . Drug use: No  . Sexual activity: Not on file   Other Topics Concern  . Not on file   Social History Narrative  . No narrative on file    Allergies  Allergen Reactions  . Morphine   . Morphine And Related Other (See Comments)    confusion  . Oxycodone Other (See Comments)    confusion  . Penicillins     Current Outpatient Prescriptions  Medication Sig Dispense Refill  . aspirin 81 MG EC tablet Take 81 mg by mouth daily.  0  . atorvastatin (LIPITOR) 80 MG tablet Take 80 mg by mouth at bedtime.  0  . BD ULTRA-FINE PEN NEEDLES 29G X 12.7MM MISC USE BEFORE MEALS AND AT BEDTIME  0  . Blood Glucose Monitoring Suppl (ONE TOUCH ULTRA 2) w/Device KIT **SIGN AOB USE TO TEST BLOOD SUGAR FOUR TIMES DAILY  0  . carvedilol (COREG) 25 MG tablet Take 25 mg by mouth 2 (two) times daily. Takes 1/2 of 25 mg pill two times a day  0  . clotrimazole (LOTRIMIN) 1 % cream APPLY TO AFFECTED & SURROUNDING AREAS OF SKIN 2 TIMES DAILY IN THE MORNING AND EVENING  3  . diltiazem (CARDIZEM CD) 180 MG 24 hr capsule Take 180 mg by mouth daily.  0  . FERREX 150 150 MG capsule  TAKE 1 CAPSULE BY ORAL ROUTE AT BEDTIME.  2  . HUMALOG KWIKPEN 100 UNIT/ML KiwkPen See admin instructions.  0  . hydrALAZINE (APRESOLINE) 50 MG tablet Take 50 mg by mouth 3 (three) times daily.  0  . Insulin Glargine (BASAGLAR KWIKPEN) 100 UNIT/ML SOPN See admin instructions.  0  . Lancets (ONETOUCH ULTRASOFT) lancets **SIGN AOB ** USE TO TEST BLOOD SUGAR BEFORE MEALS AT BEDTIME  0  . ONE TOUCH ULTRA TEST test strip TAKE 1 STRIP(S) 4 TIMES A DAY DX CODE E11.65  3  . pantoprazole (PROTONIX) 40 MG tablet Take 40 mg by mouth daily.  0  . warfarin (COUMADIN) 1 MG tablet warfarin 1 mg tablet  use as directed ; 58m + 252m= 31m46mn M/W/F alternate with 5mg25m3mg 84mmg o71mues/ Thurs/Sat/Sun    . furosemide (LASIX) 40 MG tablet furosemide 40 mg tablet  1 bid     No current facility-administered medications for this visit.      REVIEW OF SYSTEMS:  _0  denotes positive finding, _1  denotes negative finding Cardiac  Comments:  Chest pain or chest pressure:    Shortness of breath upon exertion:    Short of breath when lying flat:    Irregular heart rhythm:        Vascular    Pain in calf, thigh, or hip brought on by ambulation:    Pain in feet at night that wakes you up from your sleep:     Blood clot in your veins:    Leg swelling:         Pulmonary    Oxygen at home:    Productive cough:     Wheezing:         Neurologic    Sudden weakness in arms or legs:  X Right arm and leg   Sudden numbness in arms or legs:  X   Sudden onset of difficulty speaking or slurred speech:    Temporary loss of vision in one eye:     Problems with dizziness:         Gastrointestinal    Blood in stool:     Vomited blood:         Genitourinary    Burning when urinating:     Blood in urine:        Psychiatric    Major depression:         Hematologic    Bleeding problems:    Problems with blood clotting too easily:        Skin    Rashes or ulcers:        Constitutional    Fever or chills:     PHYSICAL EXAM:   Vitals:   01/04/17 0942  BP: (!) 158/77  Pulse: 60  Temp: 97.6 F (36.4 C)  TempSrc: Oral  SpO2: 100%  Weight: 172 lb (78 kg)  Height: _2  (1.702 m)    GENERAL: The patient is a well-nourished female, in no acute distress. The vital signs are documented above. CARDIAC: There is a regular rate and rhythm.  VASCULAR: I do not detect carotid bruits. She has palpable radial and brachial pulses bilaterally. She has no significant lower extremity swelling. PULMONARY: There is good air exchange bilaterally without wheezing or rales. ABDOMEN: Soft and non-tender with normal pitched bowel sounds.  MUSCULOSKELETAL: There are no major deformities or cyanosis. NEUROLOGIC: No focal weakness or paresthesias are detected. SKIN: There are no ulcers or  rashes noted. PSYCHIATRIC: The patient has a  normal affect.  DATA:    Arterial duplex upper extremity: I have independently interpreted her upper extremity arterial duplex.  On the right side there is a triphasic radial and ulnar signal. The brachial artery is 0.52 cm in diameter.   On the left side, there is a triphasic radial and ulnar signal. The brachial artery measures 0.49 cm in diameter.  UPPER EXTREMITY VEIN MAP: I have been thoroughly interpreted her upper extremity vein map.  On the right side the forearm cephalic vein is small. The upper arm cephalic vein is marginal in size. The basilic vein looks reasonable.  On the left side, the forearm cephalic vein is small. The upper arm cephalic vein is small. The basilic vein is reasonable.   MEDICAL ISSUES:   STAGE V CHRONIC KIDNEY DISEASE: Based on her vein map, but appears that her best option for a fistula would be a right brachiocephalic AV fistula. If this were not adequate and she could potentially have a first stage basilic vein transposition. If neither were adequate she would require placement of an AV graft. Although she is right-handed the veins in the left arm are smaller and she does not appear to have good options on the left side. I have discussed the indications for surgery. We have also discussed the potential complications of surgery. These risks include wound healing problems, infection, swelling, steal symptoms, failure of the fistula to mature or, the need for revision of the fistula, or other on vertical medical problems. In addition if we do a basilic vein transposition I think this should be done in 2 stages. We have discussed this in detail. Her surgery has been scheduled for 01/20/2017. All of her questions were answered and she is agreeable to proceed.  HYPERTENSION: The patient's initial blood pressure today was elevated. We repeated this and this was still elevated. We have encouraged the patient to follow up with their primary care physician for management  of their blood pressure.  CHRONIC COUMADIN THERAPY: She is on Coumadin because of her previous stroke. This will be held 4 days prior to her procedure.  Deitra Mayo [facility] [phone]

## 2017-01-20 NOTE — Anesthesia Preprocedure Evaluation (Signed)
Anesthesia Evaluation  Patient identified by MRN, date of birth, ID band Patient awake    Reviewed: Allergy & Precautions, NPO status , Patient's Chart, lab work & pertinent test results  Airway Mallampati: II  TM Distance: >3 FB Neck ROM: Full    Dental  (+) Teeth Intact, Dental Advisory Given   Pulmonary former smoker,    breath sounds clear to auscultation       Cardiovascular hypertension,  Rhythm:Irregular Rate:Normal     Neuro/Psych    GI/Hepatic   Endo/Other  diabetes  Renal/GU      Musculoskeletal   Abdominal   Peds  Hematology   Anesthesia Other Findings   Reproductive/Obstetrics                             Anesthesia Physical Anesthesia Plan  ASA: III  Anesthesia Plan: MAC   Post-op Pain Management:    Induction: Intravenous  PONV Risk Score and Plan: Ondansetron and Dexamethasone  Airway Management Planned: Natural Airway and Simple Face Mask  Additional Equipment:   Intra-op Plan:   Post-operative Plan:   Informed Consent: I have reviewed the patients History and Physical, chart, labs and discussed the procedure including the risks, benefits and alternatives for the proposed anesthesia with the patient or authorized representative who has indicated his/her understanding and acceptance.   Dental advisory given  Plan Discussed with: CRNA and Anesthesiologist  Anesthesia Plan Comments:         Anesthesia Quick Evaluation

## 2017-01-20 NOTE — Op Note (Signed)
    NAME: [name]    MRN: [id number] DOB: 1946/04/07    DATE OF OPERATION: 01/20/2017  PREOP DIAGNOSIS:    Stage IV chronic kidney disease  POSTOP DIAGNOSIS:    Same  PROCEDURE:    Right brachiocephalic AV fistula  SURGEON: Judeth Cornfield. Scot Dock, MD, FACS  ASSIST: Linus Orn, SA  ANESTHESIA: Local with sedation   EBL: Minimal  INDICATIONS:    Tracy Elliott is a 70 y.o. female who is not yet on dialysis. She presents for new access. Based on her vein map, her only option for a fistula was a right brachiocephalic fistula.  FINDINGS:    3.5 mm upper arm cephalic vein  TECHNIQUE:    The patient was taken to the operating room and sedated by anesthesia. Right upper extremity was prepped and draped in the usual sterile fashion. After the skin was anesthetized with LIDOCAINE, a transverse incision was made above the antecubital level. The cephalic vein was dissected free. It was ligated distally and irrigated with heparinized saline. It was a 3.5 mm vein. The brachial artery was dissected free beneath the fascia. The patient was heparinized. The brachial artery was clamped proximally and distally and a longitudinal arteriotomy was made. The vein was sewn end-to-side to the artery using continuous 6-0 Prolene suture. The completion was a good thrill in the fistula. There was a palpable radial pulse. The heparin was partially reversed with protamine. The wound was closed with a deep 3-0 Vicryl and skin closed with 4-0 Vicryl. Dermabond was applied. The patient tolerated the procedure well and was transferred to the recovery room in stable condition. All needle and sponge counts were correct.  Deitra Mayo, MD, FACS [facility]  DATE OF DICTATION:   01/20/2017

## 2017-01-20 NOTE — Progress Notes (Signed)
IV stopped for new blood draw, specimen sent to lab for K+.

## 2017-01-20 NOTE — Transfer of Care (Signed)
Immediate Anesthesia Transfer of Care Note  Patient: Tracy Elliott  Procedure(s) Performed: ARTERIOVENOUS (AV) FISTULA CREATION RIGHT UPPER ARM (Right Arm Upper)  Patient Location: PACU  Anesthesia Type:MAC  Level of Consciousness: awake, alert  and oriented  Airway & Oxygen Therapy: Patient spontaneously breathing  Post-op Assessment: Report given to RN and Post -op Vital signs reviewed and stable  Post vital signs: Reviewed and stable  Last Vitals:  Vitals:   01/20/17 0958 01/20/17 1130  BP: (!) 202/69 (!) 189/71  Pulse: (!) 56   Resp: 20   Temp: 36.8 C   SpO2: 100%     Last Pain:  Vitals:   01/20/17 0958  TempSrc: Oral         Complications: No apparent anesthesia complications

## 2017-01-20 NOTE — Progress Notes (Signed)
Call to Dr. Scot Dock, informed of ^ K+ on istat. Will send new specimen per his request for K+ to main lab.

## 2017-01-21 NOTE — Anesthesia Postprocedure Evaluation (Signed)
Anesthesia Post Note  Patient: Kavitha Lansdale  Procedure(s) Performed: ARTERIOVENOUS (AV) FISTULA CREATION RIGHT UPPER ARM (Right Arm Upper)     Patient location during evaluation: PACU Anesthesia Type: MAC Level of consciousness: awake, awake and alert and oriented Pain management: pain level controlled Vital Signs Assessment: post-procedure vital signs reviewed and stable Respiratory status: spontaneous breathing, nonlabored ventilation and respiratory function stable Cardiovascular status: blood pressure returned to baseline Anesthetic complications: no    Last Vitals:  Vitals:   01/20/17 1405 01/20/17 1448  BP: (!) 127/59 139/70  Pulse: (!) 54 (!) 50  Resp: 17 19  Temp:  (!) 36.4 C  SpO2: 100% 99%    Last Pain:  Vitals:   01/20/17 0958  TempSrc: Oral                 Roran Wegner COKER

## 2017-01-22 ENCOUNTER — Encounter (HOSPITAL_COMMUNITY): Payer: Self-pay | Admitting: Vascular Surgery

## 2017-01-23 ENCOUNTER — Telehealth: Payer: Self-pay | Admitting: Vascular Surgery

## 2017-01-23 NOTE — Telephone Encounter (Signed)
Sched appt 03/15/17; lab at 2:00 nad MD at 3:00,. Lm on hm#.

## 2017-01-23 NOTE — Telephone Encounter (Signed)
-----   Message from Mena Goes, RN sent at 01/20/2017  3:14 PM EDT ----- Regarding: 6-7 weeks w/ duplex   ----- Message ----- From: Angelia Mould, MD Sent: 01/20/2017   1:57 PM To: [facility] Charge Pool Subject: charge and f/u                                  PROCEDURE:   Right brachiocephalic AV fistula  SURGEON: Judeth Cornfield. Scot Dock, MD, FACS  ASSIST: Linus Orn, SA  This patient will need a follow up visit in 6 weeks with a duplex at that time to check on the maturation of her fistula. CD

## 2017-01-24 ENCOUNTER — Other Ambulatory Visit: Payer: Self-pay

## 2017-01-24 DIAGNOSIS — N183 Chronic kidney disease, stage 3 (moderate): Secondary | ICD-10-CM | POA: Diagnosis not present

## 2017-01-24 DIAGNOSIS — Z720 Tobacco use: Secondary | ICD-10-CM | POA: Diagnosis not present

## 2017-01-24 DIAGNOSIS — E1129 Type 2 diabetes mellitus with other diabetic kidney complication: Secondary | ICD-10-CM | POA: Diagnosis not present

## 2017-01-24 DIAGNOSIS — N186 End stage renal disease: Secondary | ICD-10-CM

## 2017-01-24 DIAGNOSIS — Z48812 Encounter for surgical aftercare following surgery on the circulatory system: Secondary | ICD-10-CM

## 2017-01-24 DIAGNOSIS — Z992 Dependence on renal dialysis: Secondary | ICD-10-CM

## 2017-01-24 DIAGNOSIS — I129 Hypertensive chronic kidney disease with stage 1 through stage 4 chronic kidney disease, or unspecified chronic kidney disease: Secondary | ICD-10-CM | POA: Diagnosis not present

## 2017-01-24 DIAGNOSIS — Z6827 Body mass index (BMI) 27.0-27.9, adult: Secondary | ICD-10-CM | POA: Diagnosis not present

## 2017-02-01 DIAGNOSIS — Z7901 Long term (current) use of anticoagulants: Secondary | ICD-10-CM | POA: Diagnosis not present

## 2017-02-06 ENCOUNTER — Encounter (INDEPENDENT_AMBULATORY_CARE_PROVIDER_SITE_OTHER): Payer: Medicare Other | Admitting: Ophthalmology

## 2017-02-20 DIAGNOSIS — I4891 Unspecified atrial fibrillation: Secondary | ICD-10-CM | POA: Diagnosis not present

## 2017-02-20 DIAGNOSIS — E785 Hyperlipidemia, unspecified: Secondary | ICD-10-CM | POA: Diagnosis not present

## 2017-02-20 DIAGNOSIS — Z23 Encounter for immunization: Secondary | ICD-10-CM | POA: Diagnosis not present

## 2017-02-20 DIAGNOSIS — Z7901 Long term (current) use of anticoagulants: Secondary | ICD-10-CM | POA: Diagnosis not present

## 2017-02-20 DIAGNOSIS — R0609 Other forms of dyspnea: Secondary | ICD-10-CM | POA: Diagnosis not present

## 2017-02-20 DIAGNOSIS — E1165 Type 2 diabetes mellitus with hyperglycemia: Secondary | ICD-10-CM | POA: Diagnosis not present

## 2017-02-27 ENCOUNTER — Encounter (INDEPENDENT_AMBULATORY_CARE_PROVIDER_SITE_OTHER): Payer: Medicare Other | Admitting: Ophthalmology

## 2017-02-27 DIAGNOSIS — H35033 Hypertensive retinopathy, bilateral: Secondary | ICD-10-CM

## 2017-02-27 DIAGNOSIS — H47211 Primary optic atrophy, right eye: Secondary | ICD-10-CM

## 2017-02-27 DIAGNOSIS — E11319 Type 2 diabetes mellitus with unspecified diabetic retinopathy without macular edema: Secondary | ICD-10-CM | POA: Diagnosis not present

## 2017-02-27 DIAGNOSIS — H43813 Vitreous degeneration, bilateral: Secondary | ICD-10-CM | POA: Diagnosis not present

## 2017-02-27 DIAGNOSIS — I10 Essential (primary) hypertension: Secondary | ICD-10-CM

## 2017-02-27 DIAGNOSIS — E113593 Type 2 diabetes mellitus with proliferative diabetic retinopathy without macular edema, bilateral: Secondary | ICD-10-CM | POA: Diagnosis not present

## 2017-03-15 ENCOUNTER — Encounter (HOSPITAL_COMMUNITY): Payer: Medicare Other

## 2017-03-15 ENCOUNTER — Encounter: Payer: Self-pay | Admitting: Vascular Surgery

## 2017-03-15 ENCOUNTER — Encounter: Payer: Medicare Other | Admitting: Vascular Surgery

## 2017-03-15 ENCOUNTER — Ambulatory Visit (HOSPITAL_COMMUNITY)
Admission: RE | Admit: 2017-03-15 | Discharge: 2017-03-15 | Disposition: A | Discharge status: Home / Self Care | Payer: Medicare Other | Source: Ambulatory Visit | Attending: Vascular Surgery | Admitting: Vascular Surgery

## 2017-03-15 ENCOUNTER — Ambulatory Visit (INDEPENDENT_AMBULATORY_CARE_PROVIDER_SITE_OTHER): Payer: Medicare Other | Admitting: Vascular Surgery

## 2017-03-15 VITALS — BP 133/85 | Temp 97.1°F | Resp 20 | Ht 67.0 in | Wt 174.0 lb

## 2017-03-15 DIAGNOSIS — Z992 Dependence on renal dialysis: Secondary | ICD-10-CM | POA: Diagnosis not present

## 2017-03-15 DIAGNOSIS — N186 End stage renal disease: Secondary | ICD-10-CM | POA: Diagnosis not present

## 2017-03-15 DIAGNOSIS — Z48812 Encounter for surgical aftercare following surgery on the circulatory system: Secondary | ICD-10-CM | POA: Diagnosis not present

## 2017-03-15 DIAGNOSIS — N185 Chronic kidney disease, stage 5: Secondary | ICD-10-CM

## 2017-03-15 NOTE — Progress Notes (Signed)
POST OPERATIVE OFFICE NOTE    CC:  F/u for surgery  HPI:  This is a 71 y.o. female who is s/p Right brachial cephalic av fistula creation.  She is doing well without symptoms of steal. She denise weakness and numbness.  Allergies  Allergen Reactions  . Morphine   . Morphine And Related Other (See Comments)    confusion  . Oxycodone Other (See Comments)    confusion  . Penicillins     Current Outpatient Medications  Medication Sig Dispense Refill  . aspirin 81 MG EC tablet Take 81 mg by mouth daily.  0  . atorvastatin (LIPITOR) 80 MG tablet Take 80 mg by mouth at bedtime.  0  . BD ULTRA-FINE PEN NEEDLES 29G X 12.7MM MISC USE BEFORE MEALS AND AT BEDTIME  0  . Blood Glucose Monitoring Suppl (ONE TOUCH ULTRA 2) w/Device KIT **SIGN AOB USE TO TEST BLOOD SUGAR FOUR TIMES DAILY  0  . carvedilol (COREG) 25 MG tablet Take 12.5 mg by mouth 2 (two) times daily. Takes 1/2 of 25 mg pill two times a day  0  . clotrimazole (LOTRIMIN) 1 % cream APPLY TO AFFECTED & SURROUNDING AREAS OF SKIN 2 TIMES DAILY IN THE MORNING AND EVENING  3  . diltiazem (CARDIZEM CD) 180 MG 24 hr capsule Take 180 mg by mouth daily.  0  . furosemide (LASIX) 40 MG tablet Take 40 mg by mouth 2 (two) times daily.    Marland Kitchen HUMALOG KWIKPEN 100 UNIT/ML KiwkPen Inject 2-12 Units into the skin 4 (four) times daily -  with meals and at bedtime. PER SLIDING SCALE  0  . hydrALAZINE (APRESOLINE) 50 MG tablet Take 50 mg by mouth 2 (two) times daily.   0  . Insulin Glargine (BASAGLAR KWIKPEN) 100 UNIT/ML SOPN Inject 26 Units into the skin every morning.   0  . iron polysaccharides (NIFEREX) 150 MG capsule Take 150 mg by mouth at bedtime.    . Lancets (ONETOUCH ULTRASOFT) lancets **SIGN AOB ** USE TO TEST BLOOD SUGAR BEFORE MEALS AT BEDTIME  0  . ONE TOUCH ULTRA TEST test strip TAKE 1 STRIP(S) 4 TIMES A DAY DX CODE E11.65  3  . pantoprazole (PROTONIX) 40 MG tablet Take 40 mg by mouth daily.  0  . traMADol (ULTRAM) 50 MG tablet Take 1  tablet (50 mg total) by mouth every 6 (six) hours as needed. 20 tablet 0  . warfarin (COUMADIN) 5 MG tablet Take 7-8 mg by mouth See admin instructions. 8 MG MON WED FRI, 7 MG ALL OTHER DAYS     No current facility-administered medications for this visit.      ROS:  See HPI  Physical Exam:  Vitals:   03/15/17 1604  BP: 133/85  Resp: 20  Temp: (!) 97.1 F (36.2 C)  SpO2: 98%    Incision:  Well healled with palpable thrill through out the fistula. Extremities:  Sensation intact grip 5/5 and palpable radial pulse  Fistula duplex demonstrates size 0.38-0.57 and depth < 0.6  Assessment/Plan:  This is a 71 y.o. female who is s/p: Brachial cephalic av fistula 2 months out.  The fistula will need more time to mature.  I asked her to use a stress ball or wash cloth to help the fistula mature.  She will f/u in 2 months for a repeat duplex.  She is not yet on HD.     Roxy Horseman  PA-C [facility] [phone]  Clinic  MD:  Scot Dock

## 2017-03-16 NOTE — Addendum Note (Signed)
Addended by: Lianne Cure A on: 03/16/2017 11:27 AM   Modules accepted: Orders

## 2017-03-24 DIAGNOSIS — Z7901 Long term (current) use of anticoagulants: Secondary | ICD-10-CM | POA: Diagnosis not present

## 2017-03-30 DIAGNOSIS — I4891 Unspecified atrial fibrillation: Secondary | ICD-10-CM | POA: Diagnosis not present

## 2017-04-25 DIAGNOSIS — N183 Chronic kidney disease, stage 3 (moderate): Secondary | ICD-10-CM | POA: Diagnosis not present

## 2017-04-25 DIAGNOSIS — Z6827 Body mass index (BMI) 27.0-27.9, adult: Secondary | ICD-10-CM | POA: Diagnosis not present

## 2017-04-25 DIAGNOSIS — E1122 Type 2 diabetes mellitus with diabetic chronic kidney disease: Secondary | ICD-10-CM | POA: Diagnosis not present

## 2017-04-25 DIAGNOSIS — I129 Hypertensive chronic kidney disease with stage 1 through stage 4 chronic kidney disease, or unspecified chronic kidney disease: Secondary | ICD-10-CM | POA: Diagnosis not present

## 2017-04-25 DIAGNOSIS — Z720 Tobacco use: Secondary | ICD-10-CM | POA: Diagnosis not present

## 2017-04-25 DIAGNOSIS — D631 Anemia in chronic kidney disease: Secondary | ICD-10-CM | POA: Diagnosis not present

## 2017-04-26 ENCOUNTER — Ambulatory Visit (INDEPENDENT_AMBULATORY_CARE_PROVIDER_SITE_OTHER): Payer: Medicare Other | Admitting: Podiatry

## 2017-04-26 ENCOUNTER — Encounter: Payer: Self-pay | Admitting: Podiatry

## 2017-04-26 DIAGNOSIS — D689 Coagulation defect, unspecified: Secondary | ICD-10-CM | POA: Diagnosis not present

## 2017-04-26 DIAGNOSIS — I10 Essential (primary) hypertension: Secondary | ICD-10-CM | POA: Diagnosis not present

## 2017-04-26 DIAGNOSIS — Z7901 Long term (current) use of anticoagulants: Secondary | ICD-10-CM | POA: Diagnosis not present

## 2017-04-26 DIAGNOSIS — E1165 Type 2 diabetes mellitus with hyperglycemia: Secondary | ICD-10-CM | POA: Diagnosis not present

## 2017-04-26 DIAGNOSIS — E785 Hyperlipidemia, unspecified: Secondary | ICD-10-CM | POA: Diagnosis not present

## 2017-04-26 DIAGNOSIS — B351 Tinea unguium: Secondary | ICD-10-CM

## 2017-04-26 DIAGNOSIS — L608 Other nail disorders: Secondary | ICD-10-CM

## 2017-04-26 DIAGNOSIS — M79676 Pain in unspecified toe(s): Secondary | ICD-10-CM | POA: Diagnosis not present

## 2017-04-26 NOTE — Progress Notes (Signed)
Complaint:  Visit Type: Patient returns to my office for continued preventative foot care services. Complaint: Patient states" my nails have grown long and thick and become painful to walk and wear shoes" Patient has been diagnosed with DM with no foot complications. The patient presents for preventative foot care services. No changes to ROS  Podiatric Exam: Vascular: dorsalis pedis and posterior tibial pulses are barely  palpable bilateral. Capillary return is immediate. Temperature gradient is WNL. Skin turgor WNL  Sensorium: Normal Semmes Weinstein monofilament test. Normal tactile sensation bilaterally. Nail Exam: Pt has thick disfigured discolored nails with subungual debris noted bilateral entire nail hallux through fifth toenails Ulcer Exam: There is no evidence of ulcer or pre-ulcerative changes or infection. Orthopedic Exam: Muscle tone and strength are WNL. No limitations in general ROM. No crepitus or effusions noted. Foot type and digits show no abnormalities. Bony prominences are unremarkable. Skin: No Porokeratosis. No infection or ulcers  Diagnosis:  Onychomycosis, , Pain in right toe, pain in left toes  Treatment & Plan Procedures and Treatment: Consent by patient was obtained for treatment procedures. The patient understood the discussion of treatment and procedures well. All questions were answered thoroughly reviewed. Debridement of mycotic and hypertrophic toenails, 1 through 5 bilateral and clearing of subungual debris. No ulceration, no infection noted. ABN signed for 2019. Return Visit-Office Procedure: Patient instructed to return to the office for a follow up visit 10 weeks. for continued evaluation and treatment.    Gardiner Barefoot DPM

## 2017-04-27 DIAGNOSIS — I4891 Unspecified atrial fibrillation: Secondary | ICD-10-CM | POA: Diagnosis not present

## 2017-05-05 DIAGNOSIS — I4891 Unspecified atrial fibrillation: Secondary | ICD-10-CM | POA: Diagnosis not present

## 2017-05-17 ENCOUNTER — Ambulatory Visit: Payer: Medicare Other | Admitting: Vascular Surgery

## 2017-05-17 ENCOUNTER — Encounter (HOSPITAL_COMMUNITY): Payer: Medicare Other

## 2017-05-30 ENCOUNTER — Ambulatory Visit (HOSPITAL_COMMUNITY)
Admission: RE | Admit: 2017-05-30 | Discharge: 2017-05-30 | Disposition: A | Discharge status: Home / Self Care | Payer: Medicare Other | Source: Ambulatory Visit | Attending: Vascular Surgery | Admitting: Vascular Surgery

## 2017-05-30 DIAGNOSIS — N185 Chronic kidney disease, stage 5: Secondary | ICD-10-CM | POA: Diagnosis not present

## 2017-05-31 ENCOUNTER — Ambulatory Visit (INDEPENDENT_AMBULATORY_CARE_PROVIDER_SITE_OTHER): Payer: Medicare Other | Admitting: Vascular Surgery

## 2017-05-31 ENCOUNTER — Encounter: Payer: Self-pay | Admitting: Vascular Surgery

## 2017-05-31 VITALS — BP 140/70 | HR 59 | Temp 97.4°F | Resp 18 | Ht 67.0 in | Wt 174.3 lb

## 2017-05-31 DIAGNOSIS — N184 Chronic kidney disease, stage 4 (severe): Secondary | ICD-10-CM

## 2017-05-31 NOTE — Progress Notes (Signed)
Patient name: Tracy Elliott MRN: [id number] DOB: 10-27-1945 Sex: female  REASON FOR VISIT:   Follow-up of right brachiocephalic AV fistula  HPI:   Tracy Elliott is a pleasant 72 y.o. female who underwent a right brachiocephalic AV fistula on 55/73/2202.  The vein was noted to be 3.5 mm.  Patient was seen by a physician's assistant on 03/15/2017.  At that time the diameters of the fistula ranged from 0.38-0.57 cm.  It was felt that the vein would need more time to mature.  Patient was set up for a 22-monthfollow-up visit.  She is still not on dialysis.  At her most recent follow-up visit it was noted that her renal function was stable.  She denies pain or paresthesias in her right arm.  Current Outpatient Medications  Medication Sig Dispense Refill  . aspirin 81 MG EC tablet Take 81 mg by mouth daily.  0  . atorvastatin (LIPITOR) 80 MG tablet Take 80 mg by mouth at bedtime.  0  . BD ULTRA-FINE PEN NEEDLES 29G X 12.7MM MISC USE BEFORE MEALS AND AT BEDTIME  0  . Blood Glucose Monitoring Suppl (ONE TOUCH ULTRA 2) w/Device KIT **SIGN AOB USE TO TEST BLOOD SUGAR FOUR TIMES DAILY  0  . carvedilol (COREG) 25 MG tablet Take 12.5 mg by mouth 2 (two) times daily. Takes 1/2 of 25 mg pill two times a day  0  . clotrimazole (LOTRIMIN) 1 % cream APPLY TO AFFECTED & SURROUNDING AREAS OF SKIN 2 TIMES DAILY IN THE MORNING AND EVENING  3  . diltiazem (CARDIZEM CD) 180 MG 24 hr capsule Take 180 mg by mouth daily.  0  . furosemide (LASIX) 40 MG tablet Take 40 mg by mouth 2 (two) times daily.    .Marland KitchenHUMALOG KWIKPEN 100 UNIT/ML KiwkPen Inject 2-12 Units into the skin 4 (four) times daily -  with meals and at bedtime. PER SLIDING SCALE  0  . hydrALAZINE (APRESOLINE) 50 MG tablet Take 50 mg by mouth 2 (two) times daily.   0  . Insulin Glargine (BASAGLAR KWIKPEN) 100 UNIT/ML SOPN Inject 26 Units into the skin every morning.   0  . iron polysaccharides (NIFEREX) 150 MG capsule Take 150 mg by mouth at bedtime.    .  Lancets (ONETOUCH ULTRASOFT) lancets **SIGN AOB ** USE TO TEST BLOOD SUGAR BEFORE MEALS AT BEDTIME  0  . ONE TOUCH ULTRA TEST test strip TAKE 1 STRIP(S) 4 TIMES A DAY DX CODE E11.65  3  . pantoprazole (PROTONIX) 40 MG tablet Take 40 mg by mouth daily.  0  . traMADol (ULTRAM) 50 MG tablet Take 1 tablet (50 mg total) by mouth every 6 (six) hours as needed. 20 tablet 0  . warfarin (COUMADIN) 5 MG tablet Take 7-8 mg by mouth See admin instructions. 8 MG MON WED FRI, 7 MG ALL OTHER DAYS     No current facility-administered medications for this visit.     REVIEW OF SYSTEMS:  [X]  denotes positive finding, [ ]  denotes negative finding Cardiac  Comments:  Chest pain or chest pressure:    Shortness of breath upon exertion:    Short of breath when lying flat:    Irregular heart rhythm:    Constitutional    Fever or chills:     PHYSICAL EXAM:   Vitals:   05/31/17 1537 05/31/17 1539  BP: (!) 143/71 140/70  Pulse: (!) 59   Resp: 18   Temp: (!) 97.4 F (36.3 C)  TempSrc: Oral   SpO2: 97%   Weight: 174 lb 4.8 oz (79.1 kg)   Height: 5' 7"  (1.702 m)     GENERAL: The patient is a well-nourished female, in no acute distress. The vital signs are documented above. CARDIOVASCULAR: There is a regular rate and rhythm. PULMONARY: There is good air exchange bilaterally without wheezing or rales. The fistula does have a reasonable thrill.  It is somewhat pulsatile proximally.  I can follow the thrill up to the upper arm.  STATUS POST RIGHT BRACHIOCEPHALIC AV FISTULA:  DATA:   DUPLEX OF RIGHT BRACHIOCEPHALIC AV FISTULA: I reviewed the duplex scan that was done yesterday which showed that the diameters of the vein are still small.  There are also some areas of significantly elevated velocity.  MEDICAL ISSUES:   STATUS POST RIGHT BRACHIOCEPHALIC AV FISTULA: The fistula does not appear to be maturing adequately.  There is some scar tissue in the proximal fistula.  We discussed possibly doing a  venoplasty with limited contrast.  However the patient would like to give this some more time before proceeding with a fistulogram and I do not think this is unreasonable.  There is a small risk if we proceed in that we will have to use a small amount of contrast.  Given that her renal function is fairly stable, I think we do have some time and it is reasonable to give her another 2 months before repeating her duplex.  I will see her back at that time.  If the fistula has not improved at that point then hopefully she will be agreeable to proceed with a fistulogram.  Deitra Mayo [facility] [phone]

## 2017-06-05 DIAGNOSIS — I4891 Unspecified atrial fibrillation: Secondary | ICD-10-CM | POA: Diagnosis not present

## 2017-06-09 ENCOUNTER — Other Ambulatory Visit: Payer: Self-pay

## 2017-06-09 DIAGNOSIS — Z992 Dependence on renal dialysis: Principal | ICD-10-CM

## 2017-06-09 DIAGNOSIS — N186 End stage renal disease: Secondary | ICD-10-CM

## 2017-07-03 DIAGNOSIS — I4891 Unspecified atrial fibrillation: Secondary | ICD-10-CM | POA: Diagnosis not present

## 2017-07-05 ENCOUNTER — Encounter: Payer: Self-pay | Admitting: Podiatry

## 2017-07-05 ENCOUNTER — Ambulatory Visit (INDEPENDENT_AMBULATORY_CARE_PROVIDER_SITE_OTHER): Payer: Medicare Other | Admitting: Podiatry

## 2017-07-05 DIAGNOSIS — D689 Coagulation defect, unspecified: Secondary | ICD-10-CM | POA: Diagnosis not present

## 2017-07-05 DIAGNOSIS — L608 Other nail disorders: Secondary | ICD-10-CM

## 2017-07-05 DIAGNOSIS — B351 Tinea unguium: Secondary | ICD-10-CM

## 2017-07-05 DIAGNOSIS — M79676 Pain in unspecified toe(s): Secondary | ICD-10-CM | POA: Diagnosis not present

## 2017-07-05 NOTE — Progress Notes (Signed)
Complaint:  Visit Type: Patient returns to my office for continued preventative foot care services. Complaint: Patient states" my nails have grown long and thick and become painful to walk and wear shoes" Patient has been diagnosed with DM with no foot complications. The patient presents for preventative foot care services. No changes to ROS  Podiatric Exam: Vascular: dorsalis pedis and posterior tibial pulses are barely  palpable bilateral. Capillary return is immediate. Temperature gradient is WNL. Skin turgor WNL  Sensorium: Normal Semmes Weinstein monofilament test. Normal tactile sensation bilaterally. Nail Exam: Pt has thick disfigured discolored nails with subungual debris noted bilateral entire nail hallux through fifth toenails Ulcer Exam: There is no evidence of ulcer or pre-ulcerative changes or infection. Orthopedic Exam: Muscle tone and strength are WNL. No limitations in general ROM. No crepitus or effusions noted. Foot type and digits show no abnormalities. Bony prominences are unremarkable. Skin: No Porokeratosis. No infection or ulcers  Diagnosis:  Onychomycosis, , Pain in right toe, pain in left toes  Treatment & Plan Procedures and Treatment: Consent by patient was obtained for treatment procedures. The patient understood the discussion of treatment and procedures well. All questions were answered thoroughly reviewed. Debridement of mycotic and hypertrophic toenails, 1 through 5 bilateral and clearing of subungual debris. No ulceration, no infection noted. . Return Visit-Office Procedure: Patient instructed to return to the office for a follow up visit 10 weeks. for continued evaluation and treatment.    Gardiner Barefoot DPM

## 2017-07-10 DIAGNOSIS — Z720 Tobacco use: Secondary | ICD-10-CM | POA: Diagnosis not present

## 2017-07-10 DIAGNOSIS — I129 Hypertensive chronic kidney disease with stage 1 through stage 4 chronic kidney disease, or unspecified chronic kidney disease: Secondary | ICD-10-CM | POA: Diagnosis not present

## 2017-07-10 DIAGNOSIS — D631 Anemia in chronic kidney disease: Secondary | ICD-10-CM | POA: Diagnosis not present

## 2017-07-10 DIAGNOSIS — N183 Chronic kidney disease, stage 3 (moderate): Secondary | ICD-10-CM | POA: Diagnosis not present

## 2017-07-10 DIAGNOSIS — I4891 Unspecified atrial fibrillation: Secondary | ICD-10-CM | POA: Diagnosis not present

## 2017-07-10 DIAGNOSIS — E1122 Type 2 diabetes mellitus with diabetic chronic kidney disease: Secondary | ICD-10-CM | POA: Diagnosis not present

## 2017-07-10 DIAGNOSIS — Z6827 Body mass index (BMI) 27.0-27.9, adult: Secondary | ICD-10-CM | POA: Diagnosis not present

## 2017-07-26 ENCOUNTER — Encounter (HOSPITAL_COMMUNITY): Payer: Medicare Other

## 2017-07-26 ENCOUNTER — Ambulatory Visit: Payer: Medicare Other | Admitting: Vascular Surgery

## 2017-08-09 DIAGNOSIS — I10 Essential (primary) hypertension: Secondary | ICD-10-CM | POA: Diagnosis not present

## 2017-08-09 DIAGNOSIS — E1165 Type 2 diabetes mellitus with hyperglycemia: Secondary | ICD-10-CM | POA: Diagnosis not present

## 2017-08-09 DIAGNOSIS — Z7901 Long term (current) use of anticoagulants: Secondary | ICD-10-CM | POA: Diagnosis not present

## 2017-08-09 DIAGNOSIS — I4891 Unspecified atrial fibrillation: Secondary | ICD-10-CM | POA: Diagnosis not present

## 2017-08-17 DIAGNOSIS — I4891 Unspecified atrial fibrillation: Secondary | ICD-10-CM | POA: Diagnosis not present

## 2017-08-30 ENCOUNTER — Encounter (INDEPENDENT_AMBULATORY_CARE_PROVIDER_SITE_OTHER): Payer: Medicare Other | Admitting: Ophthalmology

## 2017-09-13 ENCOUNTER — Ambulatory Visit (HOSPITAL_COMMUNITY)
Admission: RE | Admit: 2017-09-13 | Discharge: 2017-09-13 | Disposition: A | Discharge status: Home / Self Care | Payer: Medicare Other | Source: Ambulatory Visit | Attending: Vascular Surgery | Admitting: Vascular Surgery

## 2017-09-13 ENCOUNTER — Other Ambulatory Visit: Payer: Self-pay | Admitting: *Deleted

## 2017-09-13 ENCOUNTER — Other Ambulatory Visit: Payer: Self-pay

## 2017-09-13 ENCOUNTER — Encounter: Payer: Self-pay | Admitting: *Deleted

## 2017-09-13 ENCOUNTER — Encounter: Payer: Self-pay | Admitting: Vascular Surgery

## 2017-09-13 ENCOUNTER — Ambulatory Visit (INDEPENDENT_AMBULATORY_CARE_PROVIDER_SITE_OTHER): Payer: Medicare Other | Admitting: Vascular Surgery

## 2017-09-13 VITALS — BP 176/76 | HR 58 | Temp 98.5°F | Resp 16 | Ht 67.0 in | Wt 173.0 lb

## 2017-09-13 DIAGNOSIS — Z992 Dependence on renal dialysis: Secondary | ICD-10-CM

## 2017-09-13 DIAGNOSIS — N186 End stage renal disease: Secondary | ICD-10-CM | POA: Diagnosis not present

## 2017-09-13 NOTE — H&P (View-Only) (Signed)
Patient name: Tracy Elliott MRN: [id number] DOB: July 08, 1945 Sex: female  REASON FOR VISIT:   Follow-up of dialysis access  HPI:   Tracy Elliott is a pleasant 72 y.o. female who had a right brachiocephalic fistula placed on 01/20/2017.  The patient was noted to have a 3.5 mm upper arm cephalic vein.  I last saw the patient on 05/31/2017.  At that time, the diameters of the fistula were still small there were some areas of significantly elevated velocity.  We discussed the possibility of proceeding with venoplasty with limited contrast.  The patient felt strongly about waiting and therefore comes in for a 38-monthfollow-up visit.   Since I saw her last, she denies any recent uremic symptoms.  Specifically, she denies nausea, vomiting, fatigue, anorexia, or palpitations.  She denies pain or paresthesias in her right arm.  Current Outpatient Medications  Medication Sig Dispense Refill  . aspirin 81 MG EC tablet Take 81 mg by mouth daily.  0  . atorvastatin (LIPITOR) 80 MG tablet Take 80 mg by mouth at bedtime.  0  . BD ULTRA-FINE PEN NEEDLES 29G X 12.7MM MISC USE BEFORE MEALS AND AT BEDTIME  0  . Blood Glucose Monitoring Suppl (ONE TOUCH ULTRA 2) w/Device KIT **SIGN AOB USE TO TEST BLOOD SUGAR FOUR TIMES DAILY  0  . carvedilol (COREG) 25 MG tablet Take 12.5 mg by mouth 2 (two) times daily. Takes 1/2 of 25 mg pill two times a day  0  . clotrimazole (LOTRIMIN) 1 % cream APPLY TO AFFECTED & SURROUNDING AREAS OF SKIN 2 TIMES DAILY IN THE MORNING AND EVENING  3  . diltiazem (CARDIZEM CD) 180 MG 24 hr capsule Take 180 mg by mouth daily.  0  . furosemide (LASIX) 40 MG tablet Take 40 mg by mouth 2 (two) times daily.    .Marland KitchenHUMALOG KWIKPEN 100 UNIT/ML KiwkPen Inject 2-12 Units into the skin 4 (four) times daily -  with meals and at bedtime. PER SLIDING SCALE  0  . hydrALAZINE (APRESOLINE) 50 MG tablet Take 50 mg by mouth 2 (two) times daily.   0  . Insulin Glargine (BASAGLAR KWIKPEN) 100 UNIT/ML SOPN  Inject 26 Units into the skin every morning.   0  . iron polysaccharides (NIFEREX) 150 MG capsule Take 150 mg by mouth at bedtime.    . Lancets (ONETOUCH ULTRASOFT) lancets **SIGN AOB ** USE TO TEST BLOOD SUGAR BEFORE MEALS AT BEDTIME  0  . ONE TOUCH ULTRA TEST test strip TAKE 1 STRIP(S) 4 TIMES A DAY DX CODE E11.65  3  . pantoprazole (PROTONIX) 40 MG tablet Take 40 mg by mouth daily.  0  . traMADol (ULTRAM) 50 MG tablet Take 1 tablet (50 mg total) by mouth every 6 (six) hours as needed. 20 tablet 0  . warfarin (COUMADIN) 5 MG tablet Take 7-8 mg by mouth See admin instructions. 8 MG MON WED FRI, 7 MG ALL OTHER DAYS     No current facility-administered medications for this visit.     REVIEW OF SYSTEMS:  [X]  denotes positive finding, [ ]  denotes negative finding Cardiac  Comments:  Chest pain or chest pressure:    Shortness of breath upon exertion: x   Short of breath when lying flat:    Irregular heart rhythm:    Constitutional    Fever or chills:     PHYSICAL EXAM:   Vitals:   09/13/17 1513  BP: (!) 176/76  Pulse: (!) 58  Resp: 16  Temp: 98.5 F (36.9 C)  TempSrc: Oral  SpO2: 97%  Weight: 173 lb (78.5 kg)  Height: 5' 7"  (1.702 m)    GENERAL: The patient is a well-nourished female, in no acute distress. The vital signs are documented above. CARDIOVASCULAR: There is a regular rate and rhythm. PULMONARY: There is good air exchange bilaterally without wheezing or rales. Her fistula is pulsatile proximally and it is difficult to follow in the upper arm. She has a palpable right radial pulse.  DATA:   DUPLEX OF RIGHT BRACHIOCEPHALIC AV FISTULA: I have independently interpreted the duplex of her right brachiocephalic fistula.  The diameters are quite small and there are significantly elevated velocities in the distal upper arm and also the mid upper arm.  MEDICAL ISSUES:   STATUS POST RIGHT BRACHIOCEPHALIC AV FISTULA: Her fistula is not maturing adequately.  I have  recommended that we proceed with a fistulogram with limited contrast.  If we find a stenosis amenable to venoplasty we would address this at the same time.  I have discussed the risk of the procedure including the risk of thrombosis or injuring the fistula.  We will have to stop her Coumadin 4 days prior to the procedure.  If this fistula cannot be salvaged and she would likely require creation of a right basilic vein transposition or placement of an AV graft which could potentially be done closer to the time of her needing dialysis.  Her procedure has been scheduled for 09/29/2017.  I will make further recommendations pending these results.  Deitra Mayo [facility] [phone]

## 2017-09-13 NOTE — Progress Notes (Signed)
Patient name: Tracy Elliott MRN: [id number] DOB: March 16, 1946 Sex: female  REASON FOR VISIT:   Follow-up of dialysis access  HPI:   Tracy Elliott is a pleasant 72 y.o. female who had a right brachiocephalic fistula placed on 01/20/2017.  The patient was noted to have a 3.5 mm upper arm cephalic vein.  I last saw the patient on 05/31/2017.  At that time, the diameters of the fistula were still small there were some areas of significantly elevated velocity.  We discussed the possibility of proceeding with venoplasty with limited contrast.  The patient felt strongly about waiting and therefore comes in for a 4-monthfollow-up visit.   Since I saw her last, she denies any recent uremic symptoms.  Specifically, she denies nausea, vomiting, fatigue, anorexia, or palpitations.  She denies pain or paresthesias in her right arm.  Current Outpatient Medications  Medication Sig Dispense Refill  . aspirin 81 MG EC tablet Take 81 mg by mouth daily.  0  . atorvastatin (LIPITOR) 80 MG tablet Take 80 mg by mouth at bedtime.  0  . BD ULTRA-FINE PEN NEEDLES 29G X 12.7MM MISC USE BEFORE MEALS AND AT BEDTIME  0  . Blood Glucose Monitoring Suppl (ONE TOUCH ULTRA 2) w/Device KIT **SIGN AOB USE TO TEST BLOOD SUGAR FOUR TIMES DAILY  0  . carvedilol (COREG) 25 MG tablet Take 12.5 mg by mouth 2 (two) times daily. Takes 1/2 of 25 mg pill two times a day  0  . clotrimazole (LOTRIMIN) 1 % cream APPLY TO AFFECTED & SURROUNDING AREAS OF SKIN 2 TIMES DAILY IN THE MORNING AND EVENING  3  . diltiazem (CARDIZEM CD) 180 MG 24 hr capsule Take 180 mg by mouth daily.  0  . furosemide (LASIX) 40 MG tablet Take 40 mg by mouth 2 (two) times daily.    .Marland KitchenHUMALOG KWIKPEN 100 UNIT/ML KiwkPen Inject 2-12 Units into the skin 4 (four) times daily -  with meals and at bedtime. PER SLIDING SCALE  0  . hydrALAZINE (APRESOLINE) 50 MG tablet Take 50 mg by mouth 2 (two) times daily.   0  . Insulin Glargine (BASAGLAR KWIKPEN) 100 UNIT/ML SOPN  Inject 26 Units into the skin every morning.   0  . iron polysaccharides (NIFEREX) 150 MG capsule Take 150 mg by mouth at bedtime.    . Lancets (ONETOUCH ULTRASOFT) lancets **SIGN AOB ** USE TO TEST BLOOD SUGAR BEFORE MEALS AT BEDTIME  0  . ONE TOUCH ULTRA TEST test strip TAKE 1 STRIP(S) 4 TIMES A DAY DX CODE E11.65  3  . pantoprazole (PROTONIX) 40 MG tablet Take 40 mg by mouth daily.  0  . traMADol (ULTRAM) 50 MG tablet Take 1 tablet (50 mg total) by mouth every 6 (six) hours as needed. 20 tablet 0  . warfarin (COUMADIN) 5 MG tablet Take 7-8 mg by mouth See admin instructions. 8 MG MON WED FRI, 7 MG ALL OTHER DAYS     No current facility-administered medications for this visit.     REVIEW OF SYSTEMS:  [X]  denotes positive finding, [ ]  denotes negative finding Cardiac  Comments:  Chest pain or chest pressure:    Shortness of breath upon exertion: x   Short of breath when lying flat:    Irregular heart rhythm:    Constitutional    Fever or chills:     PHYSICAL EXAM:   Vitals:   09/13/17 1513  BP: (!) 176/76  Pulse: (!) 58  Resp: 16  Temp: 98.5 F (36.9 C)  TempSrc: Oral  SpO2: 97%  Weight: 173 lb (78.5 kg)  Height: 5' 7"  (1.702 m)    GENERAL: The patient is a well-nourished female, in no acute distress. The vital signs are documented above. CARDIOVASCULAR: There is a regular rate and rhythm. PULMONARY: There is good air exchange bilaterally without wheezing or rales. Her fistula is pulsatile proximally and it is difficult to follow in the upper arm. She has a palpable right radial pulse.  DATA:   DUPLEX OF RIGHT BRACHIOCEPHALIC AV FISTULA: I have independently interpreted the duplex of her right brachiocephalic fistula.  The diameters are quite small and there are significantly elevated velocities in the distal upper arm and also the mid upper arm.  MEDICAL ISSUES:   STATUS POST RIGHT BRACHIOCEPHALIC AV FISTULA: Her fistula is not maturing adequately.  I have  recommended that we proceed with a fistulogram with limited contrast.  If we find a stenosis amenable to venoplasty we would address this at the same time.  I have discussed the risk of the procedure including the risk of thrombosis or injuring the fistula.  We will have to stop her Coumadin 4 days prior to the procedure.  If this fistula cannot be salvaged and she would likely require creation of a right basilic vein transposition or placement of an AV graft which could potentially be done closer to the time of her needing dialysis.  Her procedure has been scheduled for 09/29/2017.  I will make further recommendations pending these results.  Deitra Mayo [facility] [phone]

## 2017-09-14 ENCOUNTER — Encounter (INDEPENDENT_AMBULATORY_CARE_PROVIDER_SITE_OTHER): Payer: Medicare Other | Admitting: Ophthalmology

## 2017-09-14 DIAGNOSIS — H43813 Vitreous degeneration, bilateral: Secondary | ICD-10-CM

## 2017-09-14 DIAGNOSIS — H35033 Hypertensive retinopathy, bilateral: Secondary | ICD-10-CM

## 2017-09-14 DIAGNOSIS — E113593 Type 2 diabetes mellitus with proliferative diabetic retinopathy without macular edema, bilateral: Secondary | ICD-10-CM | POA: Diagnosis not present

## 2017-09-14 DIAGNOSIS — H3411 Central retinal artery occlusion, right eye: Secondary | ICD-10-CM | POA: Diagnosis not present

## 2017-09-14 DIAGNOSIS — I10 Essential (primary) hypertension: Secondary | ICD-10-CM | POA: Diagnosis not present

## 2017-09-14 DIAGNOSIS — E11319 Type 2 diabetes mellitus with unspecified diabetic retinopathy without macular edema: Secondary | ICD-10-CM

## 2017-09-14 DIAGNOSIS — I4891 Unspecified atrial fibrillation: Secondary | ICD-10-CM | POA: Diagnosis not present

## 2017-09-29 ENCOUNTER — Encounter (HOSPITAL_COMMUNITY): Payer: Self-pay | Admitting: Vascular Surgery

## 2017-09-29 ENCOUNTER — Ambulatory Visit (HOSPITAL_COMMUNITY)
Admission: RE | Admit: 2017-09-29 | Discharge: 2017-09-29 | Disposition: A | Discharge status: Home / Self Care | Payer: Medicare Other | Source: Ambulatory Visit | Attending: Vascular Surgery | Admitting: Vascular Surgery

## 2017-09-29 ENCOUNTER — Encounter (HOSPITAL_COMMUNITY)
Admission: RE | Disposition: A | Discharge status: Home / Self Care | Payer: Self-pay | Source: Ambulatory Visit | Attending: Vascular Surgery

## 2017-09-29 DIAGNOSIS — Y832 Surgical operation with anastomosis, bypass or graft as the cause of abnormal reaction of the patient, or of later complication, without mention of misadventure at the time of the procedure: Secondary | ICD-10-CM | POA: Insufficient documentation

## 2017-09-29 DIAGNOSIS — Z7901 Long term (current) use of anticoagulants: Secondary | ICD-10-CM | POA: Insufficient documentation

## 2017-09-29 DIAGNOSIS — Z7982 Long term (current) use of aspirin: Secondary | ICD-10-CM | POA: Diagnosis not present

## 2017-09-29 DIAGNOSIS — T82868A Thrombosis of vascular prosthetic devices, implants and grafts, initial encounter: Secondary | ICD-10-CM | POA: Insufficient documentation

## 2017-09-29 HISTORY — PX: A/V FISTULAGRAM: CATH118298

## 2017-09-29 LAB — PROTIME-INR
INR: 1.59
Prothrombin Time: 18.8 seconds — ABNORMAL HIGH (ref 11.4–15.2)

## 2017-09-29 LAB — POCT I-STAT, CHEM 8
BUN: 69 mg/dL — ABNORMAL HIGH (ref 6–20)
CALCIUM ION: 1.37 mmol/L (ref 1.15–1.40)
Chloride: 105 mmol/L (ref 101–111)
Creatinine, Ser: 3.6 mg/dL — ABNORMAL HIGH (ref 0.44–1.00)
Glucose, Bld: 133 mg/dL — ABNORMAL HIGH (ref 65–99)
HCT: 39 % (ref 36.0–46.0)
HEMOGLOBIN: 13.3 g/dL (ref 12.0–15.0)
Potassium: 4.9 mmol/L (ref 3.5–5.1)
SODIUM: 141 mmol/L (ref 135–145)
TCO2: 27 mmol/L (ref 22–32)

## 2017-09-29 LAB — GLUCOSE, CAPILLARY: GLUCOSE-CAPILLARY: 134 mg/dL — AB (ref 65–99)

## 2017-09-29 SURGERY — A/V FISTULAGRAM
Anesthesia: LOCAL

## 2017-09-29 MED ORDER — MIDAZOLAM HCL 2 MG/2ML IJ SOLN
INTRAMUSCULAR | Status: DC | PRN
  Administered 2017-09-29: 1 mg via INTRAVENOUS

## 2017-09-29 MED ORDER — MIDAZOLAM HCL 2 MG/2ML IJ SOLN
INTRAMUSCULAR | Status: AC
  Filled 2017-09-29: qty 2

## 2017-09-29 MED ORDER — HEPARIN (PORCINE) IN NACL 1000-0.9 UT/500ML-% IV SOLN
INTRAVENOUS | Status: AC
  Filled 2017-09-29: qty 500

## 2017-09-29 MED ORDER — HEPARIN (PORCINE) IN NACL 2-0.9 UNITS/ML
INTRAMUSCULAR | Status: AC | PRN
  Administered 2017-09-29: 500 mL

## 2017-09-29 MED ORDER — FENTANYL CITRATE (PF) 100 MCG/2ML IJ SOLN
INTRAMUSCULAR | Status: AC
  Filled 2017-09-29: qty 2

## 2017-09-29 MED ORDER — FENTANYL CITRATE (PF) 100 MCG/2ML IJ SOLN
INTRAMUSCULAR | Status: DC | PRN
  Administered 2017-09-29: 50 ug via INTRAVENOUS

## 2017-09-29 MED ORDER — LIDOCAINE HCL (PF) 1 % IJ SOLN
INTRAMUSCULAR | Status: AC
  Filled 2017-09-29: qty 30

## 2017-09-29 MED ORDER — LIDOCAINE HCL (PF) 1 % IJ SOLN
INTRAMUSCULAR | Status: DC | PRN
  Administered 2017-09-29: 5 mL via INTRADERMAL

## 2017-09-29 MED ORDER — SODIUM CHLORIDE 0.9 % IV SOLN
INTRAVENOUS | Status: DC | PRN
  Administered 2017-09-29: 50 mL/h via INTRAVENOUS

## 2017-09-29 SURGICAL SUPPLY — 10 items
BAG SNAP BAND KOVER 36X36 (MISCELLANEOUS) ×2 IMPLANT
COVER PRB 48X5XTLSCP FOLD TPE (BAG) ×1 IMPLANT
COVER PROBE 5X48 (BAG) ×1
KIT MICROPUNCTURE NIT STIFF (SHEATH) ×2 IMPLANT
PROTECTION STATION PRESSURIZED (MISCELLANEOUS) ×2
STATION PROTECTION PRESSURIZED (MISCELLANEOUS) ×1 IMPLANT
STOPCOCK MORSE 400PSI 3WAY (MISCELLANEOUS) ×2 IMPLANT
TRAY PV CATH (CUSTOM PROCEDURE TRAY) ×2 IMPLANT
TUBING CIL FLEX 10 FLL-RA (TUBING) ×2 IMPLANT
WIRE TORQFLEX AUST .018X40CM (WIRE) ×2 IMPLANT

## 2017-09-29 NOTE — Op Note (Signed)
   PATIENT: Tracy Elliott      MRN: [id number] DOB: [birth year]    DATE OF PROCEDURE: 09/29/2017  INDICATIONS:    Tracy Elliott is a 72 y.o. female who presents with a non-maturing right brachiocephalic AV fistula.  She is not on dialysis.   PROCEDURE:    Ultrasound-guided access to right brachiocephalic AV fistula  SURGEON: Judeth Cornfield. Scot Dock, MD, FACS  ANESTHESIA: Local with sedation  EBL: Minimal  TECHNIQUE: The patient was brought to the peripheral vascular lab and was sedated with 1 mg of Versed and 50 mcg of fentanyl. The period of conscious sedation was 28 minutes.  During that time period, I was present face-to-face 100% of the time. The patient's heart rate, blood pressure, and oxygen saturation were monitored by the nurse continuously during the procedure.  The proximal brachiocephalic fistula was cannulated under ultrasound guidance with a micropuncture needle and a micropuncture wire was introduced.  The sheath was then placed but there was no backbleeding from the sheath.  I then used the duplex scanner with color-flow and it became apparent that this fistula was occluded.  Therefore the sheath was removed and pressure held for hemostasis.  No immediate complications were noted.  FINDINGS:   Occluded right brachiocephalic AV fistula  CLINICAL NOTE: I have examined the basilic vein on the right with the duplex scanner.  The vein appears somewhat thickened and small.  Thus I do not think she is a good candidate for a basilic vein transposition.  Given that she is not on dialysis I would recommend waiting to place an AV graft until she is closer to needing dialysis.  I have discussed this with the patient and also her niece  Tracy Mayo, MD, FACS [facility] [place] DICTATION:   09/29/2017

## 2017-09-29 NOTE — Interval H&P Note (Signed)
History and Physical Interval Note:  09/29/2017 11:59 AM  Tracy Elliott  has presented today for surgery, with the diagnosis of poor flow  The various methods of treatment have been discussed with the patient and family. After consideration of risks, benefits and other options for treatment, the patient has consented to  Procedure(s): A/V FISTULAGRAM - Right Arm (N/A) as a surgical intervention .  The patient's history has been reviewed, patient examined, no change in status, stable for surgery.  I have reviewed the patient's chart and labs.  Questions were answered to the patient's satisfaction.     Deitra Mayo

## 2017-09-29 NOTE — Discharge Instructions (Signed)

## 2017-10-02 DIAGNOSIS — T82868A Thrombosis of vascular prosthetic devices, implants and grafts, initial encounter: Secondary | ICD-10-CM | POA: Diagnosis not present

## 2017-10-04 ENCOUNTER — Ambulatory Visit (INDEPENDENT_AMBULATORY_CARE_PROVIDER_SITE_OTHER): Payer: Medicare Other | Admitting: Podiatry

## 2017-10-04 ENCOUNTER — Encounter: Payer: Self-pay | Admitting: Podiatry

## 2017-10-04 DIAGNOSIS — D689 Coagulation defect, unspecified: Secondary | ICD-10-CM

## 2017-10-04 DIAGNOSIS — B351 Tinea unguium: Secondary | ICD-10-CM | POA: Diagnosis not present

## 2017-10-04 DIAGNOSIS — M79676 Pain in unspecified toe(s): Secondary | ICD-10-CM | POA: Diagnosis not present

## 2017-10-04 DIAGNOSIS — L608 Other nail disorders: Secondary | ICD-10-CM

## 2017-10-04 NOTE — Progress Notes (Signed)
Complaint:  Visit Type: Patient returns to my office for continued preventative foot care services. Complaint: Patient states" my nails have grown long and thick and become painful to walk and wear shoes" Patient has been diagnosed with DM with no foot complications. The patient presents for preventative foot care services. No changes to ROS  Podiatric Exam: Vascular: dorsalis pedis and posterior tibial pulses are barely  palpable bilateral. Capillary return is immediate. Temperature gradient is WNL. Skin turgor WNL  Sensorium: Normal Semmes Weinstein monofilament test. Normal tactile sensation bilaterally. Nail Exam: Pt has thick disfigured discolored nails with subungual debris noted bilateral entire nail hallux through fifth toenails Ulcer Exam: There is no evidence of ulcer or pre-ulcerative changes or infection. Orthopedic Exam: Muscle tone and strength are WNL. No limitations in general ROM. No crepitus or effusions noted. Foot type and digits show no abnormalities. Bony prominences are unremarkable. Skin: No Porokeratosis. No infection or ulcers  Diagnosis:  Onychomycosis, , Pain in right toe, pain in left toes  Treatment & Plan Procedures and Treatment: Consent by patient was obtained for treatment procedures. The patient understood the discussion of treatment and procedures well. All questions were answered thoroughly reviewed. Debridement of mycotic and hypertrophic toenails, 1 through 5 bilateral and clearing of subungual debris. No ulceration, no infection noted. . Return Visit-Office Procedure: Patient instructed to return to the office for a follow up visit 10 weeks. for continued evaluation and treatment.    Gardiner Barefoot DPM

## 2017-10-17 DIAGNOSIS — Z7901 Long term (current) use of anticoagulants: Secondary | ICD-10-CM | POA: Diagnosis not present

## 2017-10-31 DIAGNOSIS — N183 Chronic kidney disease, stage 3 (moderate): Secondary | ICD-10-CM | POA: Diagnosis not present

## 2017-10-31 DIAGNOSIS — D631 Anemia in chronic kidney disease: Secondary | ICD-10-CM | POA: Diagnosis not present

## 2017-10-31 DIAGNOSIS — E1122 Type 2 diabetes mellitus with diabetic chronic kidney disease: Secondary | ICD-10-CM | POA: Diagnosis not present

## 2017-10-31 DIAGNOSIS — Z6827 Body mass index (BMI) 27.0-27.9, adult: Secondary | ICD-10-CM | POA: Diagnosis not present

## 2017-10-31 DIAGNOSIS — I129 Hypertensive chronic kidney disease with stage 1 through stage 4 chronic kidney disease, or unspecified chronic kidney disease: Secondary | ICD-10-CM | POA: Diagnosis not present

## 2017-10-31 DIAGNOSIS — Z720 Tobacco use: Secondary | ICD-10-CM | POA: Diagnosis not present

## 2017-11-10 DIAGNOSIS — Z7901 Long term (current) use of anticoagulants: Secondary | ICD-10-CM | POA: Diagnosis not present

## 2017-12-08 DIAGNOSIS — Z7901 Long term (current) use of anticoagulants: Secondary | ICD-10-CM | POA: Diagnosis not present

## 2017-12-13 ENCOUNTER — Ambulatory Visit: Payer: Medicare Other | Admitting: Podiatry

## 2017-12-13 DIAGNOSIS — Z0001 Encounter for general adult medical examination with abnormal findings: Secondary | ICD-10-CM | POA: Diagnosis not present

## 2017-12-13 DIAGNOSIS — I4891 Unspecified atrial fibrillation: Secondary | ICD-10-CM | POA: Diagnosis not present

## 2017-12-13 DIAGNOSIS — E1165 Type 2 diabetes mellitus with hyperglycemia: Secondary | ICD-10-CM | POA: Diagnosis not present

## 2017-12-13 DIAGNOSIS — I10 Essential (primary) hypertension: Secondary | ICD-10-CM | POA: Diagnosis not present

## 2017-12-13 DIAGNOSIS — R5383 Other fatigue: Secondary | ICD-10-CM | POA: Diagnosis not present

## 2017-12-13 DIAGNOSIS — Z7901 Long term (current) use of anticoagulants: Secondary | ICD-10-CM | POA: Diagnosis not present

## 2017-12-13 DIAGNOSIS — I639 Cerebral infarction, unspecified: Secondary | ICD-10-CM | POA: Diagnosis not present

## 2017-12-13 DIAGNOSIS — N184 Chronic kidney disease, stage 4 (severe): Secondary | ICD-10-CM | POA: Diagnosis not present

## 2017-12-13 DIAGNOSIS — Z136 Encounter for screening for cardiovascular disorders: Secondary | ICD-10-CM | POA: Diagnosis not present

## 2017-12-19 ENCOUNTER — Ambulatory Visit (INDEPENDENT_AMBULATORY_CARE_PROVIDER_SITE_OTHER): Payer: Medicare Other | Admitting: Podiatry

## 2017-12-19 ENCOUNTER — Encounter: Payer: Self-pay | Admitting: Podiatry

## 2017-12-19 DIAGNOSIS — B351 Tinea unguium: Secondary | ICD-10-CM

## 2017-12-19 DIAGNOSIS — M79676 Pain in unspecified toe(s): Secondary | ICD-10-CM

## 2017-12-19 DIAGNOSIS — L608 Other nail disorders: Secondary | ICD-10-CM

## 2017-12-19 DIAGNOSIS — D689 Coagulation defect, unspecified: Secondary | ICD-10-CM

## 2017-12-19 NOTE — Progress Notes (Signed)
Complaint:  Visit Type: Patient returns to my office for continued preventative foot care services. Complaint: Patient states" my nails have grown long and thick and become painful to walk and wear shoes" Patient has been diagnosed with DM with no foot complications. The patient presents for preventative foot care services. No changes to ROS  Podiatric Exam: Vascular: dorsalis pedis and posterior tibial pulses are barely  palpable bilateral. Capillary return is immediate. Temperature gradient is WNL. Skin turgor WNL  Sensorium: Normal Semmes Weinstein monofilament test. Normal tactile sensation bilaterally. Nail Exam: Pt has thick disfigured discolored nails with subungual debris noted bilateral entire nail hallux through fifth toenails Ulcer Exam: There is no evidence of ulcer or pre-ulcerative changes or infection. Orthopedic Exam: Muscle tone and strength are WNL. No limitations in general ROM. No crepitus or effusions noted. Foot type and digits show no abnormalities. Bony prominences are unremarkable. Skin: No Porokeratosis. No infection or ulcers  Diagnosis:  Onychomycosis, , Pain in right toe, pain in left toes  Treatment & Plan Procedures and Treatment: Consent by patient was obtained for treatment procedures. The patient understood the discussion of treatment and procedures well. All questions were answered thoroughly reviewed. Debridement of mycotic and hypertrophic toenails, 1 through 5 bilateral and clearing of subungual debris. No ulceration, no infection noted. . Return Visit-Office Procedure: Patient instructed to return to the office for a follow up visit 10 weeks. for continued evaluation and treatment.    Gardiner Barefoot DPM

## 2018-01-10 DIAGNOSIS — R5383 Other fatigue: Secondary | ICD-10-CM | POA: Diagnosis not present

## 2018-01-10 DIAGNOSIS — Z7901 Long term (current) use of anticoagulants: Secondary | ICD-10-CM | POA: Diagnosis not present

## 2018-01-10 DIAGNOSIS — Z136 Encounter for screening for cardiovascular disorders: Secondary | ICD-10-CM | POA: Diagnosis not present

## 2018-01-10 DIAGNOSIS — Z0001 Encounter for general adult medical examination with abnormal findings: Secondary | ICD-10-CM | POA: Diagnosis not present

## 2018-01-29 DIAGNOSIS — Z13820 Encounter for screening for osteoporosis: Secondary | ICD-10-CM | POA: Diagnosis not present

## 2018-01-29 DIAGNOSIS — I10 Essential (primary) hypertension: Secondary | ICD-10-CM | POA: Diagnosis not present

## 2018-01-29 DIAGNOSIS — I480 Paroxysmal atrial fibrillation: Secondary | ICD-10-CM | POA: Diagnosis not present

## 2018-01-29 DIAGNOSIS — I4891 Unspecified atrial fibrillation: Secondary | ICD-10-CM | POA: Diagnosis not present

## 2018-01-29 DIAGNOSIS — D509 Iron deficiency anemia, unspecified: Secondary | ICD-10-CM | POA: Diagnosis not present

## 2018-01-29 DIAGNOSIS — K219 Gastro-esophageal reflux disease without esophagitis: Secondary | ICD-10-CM | POA: Diagnosis not present

## 2018-01-29 DIAGNOSIS — Z23 Encounter for immunization: Secondary | ICD-10-CM | POA: Diagnosis not present

## 2018-01-29 DIAGNOSIS — Z7189 Other specified counseling: Secondary | ICD-10-CM | POA: Diagnosis not present

## 2018-01-29 DIAGNOSIS — E1165 Type 2 diabetes mellitus with hyperglycemia: Secondary | ICD-10-CM | POA: Diagnosis not present

## 2018-01-29 DIAGNOSIS — Z1231 Encounter for screening mammogram for malignant neoplasm of breast: Secondary | ICD-10-CM | POA: Diagnosis not present

## 2018-01-29 DIAGNOSIS — Z0001 Encounter for general adult medical examination with abnormal findings: Secondary | ICD-10-CM | POA: Diagnosis not present

## 2018-01-29 DIAGNOSIS — Z1211 Encounter for screening for malignant neoplasm of colon: Secondary | ICD-10-CM | POA: Diagnosis not present

## 2018-02-02 ENCOUNTER — Other Ambulatory Visit: Payer: Self-pay | Admitting: Internal Medicine

## 2018-02-02 DIAGNOSIS — Z1231 Encounter for screening mammogram for malignant neoplasm of breast: Secondary | ICD-10-CM

## 2018-02-05 DIAGNOSIS — E1122 Type 2 diabetes mellitus with diabetic chronic kidney disease: Secondary | ICD-10-CM | POA: Diagnosis not present

## 2018-02-05 DIAGNOSIS — N2581 Secondary hyperparathyroidism of renal origin: Secondary | ICD-10-CM | POA: Diagnosis not present

## 2018-02-05 DIAGNOSIS — I129 Hypertensive chronic kidney disease with stage 1 through stage 4 chronic kidney disease, or unspecified chronic kidney disease: Secondary | ICD-10-CM | POA: Diagnosis not present

## 2018-02-05 DIAGNOSIS — Z6827 Body mass index (BMI) 27.0-27.9, adult: Secondary | ICD-10-CM | POA: Diagnosis not present

## 2018-02-05 DIAGNOSIS — Z720 Tobacco use: Secondary | ICD-10-CM | POA: Diagnosis not present

## 2018-02-05 DIAGNOSIS — D631 Anemia in chronic kidney disease: Secondary | ICD-10-CM | POA: Diagnosis not present

## 2018-02-05 DIAGNOSIS — N183 Chronic kidney disease, stage 3 (moderate): Secondary | ICD-10-CM | POA: Diagnosis not present

## 2018-02-06 ENCOUNTER — Other Ambulatory Visit: Payer: Self-pay | Admitting: Internal Medicine

## 2018-02-06 DIAGNOSIS — E2839 Other primary ovarian failure: Secondary | ICD-10-CM

## 2018-02-14 DIAGNOSIS — Z7901 Long term (current) use of anticoagulants: Secondary | ICD-10-CM | POA: Diagnosis not present

## 2018-02-15 DIAGNOSIS — Z79899 Other long term (current) drug therapy: Secondary | ICD-10-CM | POA: Diagnosis not present

## 2018-02-27 ENCOUNTER — Ambulatory Visit: Payer: Medicare Other | Admitting: Podiatry

## 2018-03-15 ENCOUNTER — Encounter (INDEPENDENT_AMBULATORY_CARE_PROVIDER_SITE_OTHER): Payer: Medicare Other | Admitting: Ophthalmology

## 2018-03-22 DIAGNOSIS — Z7901 Long term (current) use of anticoagulants: Secondary | ICD-10-CM | POA: Diagnosis not present

## 2018-03-26 DIAGNOSIS — I480 Paroxysmal atrial fibrillation: Secondary | ICD-10-CM | POA: Diagnosis not present

## 2018-03-26 DIAGNOSIS — Z7901 Long term (current) use of anticoagulants: Secondary | ICD-10-CM | POA: Diagnosis not present

## 2018-04-03 DIAGNOSIS — Z7901 Long term (current) use of anticoagulants: Secondary | ICD-10-CM | POA: Diagnosis not present

## 2018-04-03 DIAGNOSIS — I480 Paroxysmal atrial fibrillation: Secondary | ICD-10-CM | POA: Diagnosis not present

## 2018-04-05 ENCOUNTER — Ambulatory Visit (INDEPENDENT_AMBULATORY_CARE_PROVIDER_SITE_OTHER): Payer: Medicare Other | Admitting: Podiatry

## 2018-04-05 DIAGNOSIS — L84 Corns and callosities: Secondary | ICD-10-CM

## 2018-04-05 DIAGNOSIS — M79676 Pain in unspecified toe(s): Secondary | ICD-10-CM

## 2018-04-05 DIAGNOSIS — E1142 Type 2 diabetes mellitus with diabetic polyneuropathy: Secondary | ICD-10-CM | POA: Diagnosis not present

## 2018-04-05 DIAGNOSIS — B351 Tinea unguium: Secondary | ICD-10-CM | POA: Diagnosis not present

## 2018-04-05 NOTE — Patient Instructions (Addendum)

## 2018-04-10 DIAGNOSIS — Z7901 Long term (current) use of anticoagulants: Secondary | ICD-10-CM | POA: Diagnosis not present

## 2018-04-10 DIAGNOSIS — I480 Paroxysmal atrial fibrillation: Secondary | ICD-10-CM | POA: Diagnosis not present

## 2018-04-12 ENCOUNTER — Other Ambulatory Visit: Payer: Medicare Other

## 2018-04-12 ENCOUNTER — Ambulatory Visit: Payer: Medicare Other

## 2018-04-17 DIAGNOSIS — I480 Paroxysmal atrial fibrillation: Secondary | ICD-10-CM | POA: Diagnosis not present

## 2018-04-17 DIAGNOSIS — Z7901 Long term (current) use of anticoagulants: Secondary | ICD-10-CM | POA: Diagnosis not present

## 2018-04-23 ENCOUNTER — Encounter (INDEPENDENT_AMBULATORY_CARE_PROVIDER_SITE_OTHER): Payer: Medicare Other | Admitting: Ophthalmology

## 2018-04-23 DIAGNOSIS — I10 Essential (primary) hypertension: Secondary | ICD-10-CM | POA: Diagnosis not present

## 2018-04-23 DIAGNOSIS — H35033 Hypertensive retinopathy, bilateral: Secondary | ICD-10-CM

## 2018-04-23 DIAGNOSIS — E113593 Type 2 diabetes mellitus with proliferative diabetic retinopathy without macular edema, bilateral: Secondary | ICD-10-CM

## 2018-04-23 DIAGNOSIS — E11319 Type 2 diabetes mellitus with unspecified diabetic retinopathy without macular edema: Secondary | ICD-10-CM | POA: Diagnosis not present

## 2018-04-23 DIAGNOSIS — H43813 Vitreous degeneration, bilateral: Secondary | ICD-10-CM | POA: Diagnosis not present

## 2018-04-25 DIAGNOSIS — E1165 Type 2 diabetes mellitus with hyperglycemia: Secondary | ICD-10-CM | POA: Diagnosis not present

## 2018-04-25 DIAGNOSIS — E785 Hyperlipidemia, unspecified: Secondary | ICD-10-CM | POA: Diagnosis not present

## 2018-04-25 DIAGNOSIS — K219 Gastro-esophageal reflux disease without esophagitis: Secondary | ICD-10-CM | POA: Diagnosis not present

## 2018-04-25 DIAGNOSIS — Z7901 Long term (current) use of anticoagulants: Secondary | ICD-10-CM | POA: Diagnosis not present

## 2018-04-25 DIAGNOSIS — N184 Chronic kidney disease, stage 4 (severe): Secondary | ICD-10-CM | POA: Diagnosis not present

## 2018-04-25 DIAGNOSIS — E113593 Type 2 diabetes mellitus with proliferative diabetic retinopathy without macular edema, bilateral: Secondary | ICD-10-CM | POA: Diagnosis not present

## 2018-04-25 DIAGNOSIS — I10 Essential (primary) hypertension: Secondary | ICD-10-CM | POA: Diagnosis not present

## 2018-05-05 ENCOUNTER — Encounter: Payer: Self-pay | Admitting: Podiatry

## 2018-05-05 NOTE — Progress Notes (Signed)
Subjective: Tracy Elliott presents today with painful, thick toenails 1-5 b/l that she cannot cut and which interfere with daily activities.  Pain is aggravated when wearing enclosed shoe gear.  Audley Hose, MD is her PCP.  Her last visit was January 29, 2018.   Current Outpatient Medications:  .  aspirin 81 MG EC tablet, Take 81 mg by mouth daily., Disp: , Rfl: 0 .  ASPIRIN 81 PO, aspirin 81 mg tablet,delayed release  TAKE 1 TABLET BY MOUTH EVERY DAY, Disp: , Rfl:  .  atorvastatin (LIPITOR) 80 MG tablet, atorvastatin 80 mg tablet  Take 1 tablet(s) every day by oral route at bedtime for 90 days., Disp: , Rfl:  .  BD ULTRA-FINE PEN NEEDLES 29G X 12.7MM MISC, USE BEFORE MEALS AND AT BEDTIME, Disp: , Rfl: 0 .  Blood Glucose Monitoring Suppl (ONE TOUCH ULTRA 2) w/Device KIT, **SIGN AOB USE TO TEST BLOOD SUGAR FOUR TIMES DAILY, Disp: , Rfl: 0 .  carvedilol (COREG) 25 MG tablet, Take 12.5 mg by mouth 2 (two) times daily. , Disp: , Rfl: 0 .  clotrimazole (LOTRIMIN) 1 % cream, APPLY TO AFFECTED & SURROUNDING AREAS OF SKIN 2 TIMES DAILY IN THE MORNING AND EVENING, Disp: , Rfl: 3 .  diltiazem (CARDIZEM CD) 180 MG 24 hr capsule, Take 180 mg by mouth daily., Disp: , Rfl: 0 .  furosemide (LASIX) 40 MG tablet, Take 40 mg by mouth 2 (two) times daily., Disp: , Rfl:  .  glucose blood test strip, OneTouch Ultra Test strips  Take 1 strip(s) 4 times a day by miscell. route for 30 days., Disp: , Rfl:  .  hydrALAZINE (APRESOLINE) 50 MG tablet, Take 50 mg by mouth 2 (two) times daily. , Disp: , Rfl: 0 .  insulin aspart (NOVOLOG FLEXPEN) 100 UNIT/ML FlexPen, Inject 2-12 Units into the skin 4 (four) times daily -  with meals and at bedtime. Per sliding scale, Disp: , Rfl:  .  Insulin Glargine (BASAGLAR KWIKPEN) 100 UNIT/ML SOPN, Basaglar KwikPen U-100 Insulin 100 unit/mL (3 mL) subcutaneous  Inject 24 unit(s) every day by subcutaneous route in the morning for 30 days., Disp: , Rfl:  .  Insulin Pen Needle 29G X  12.7MM MISC, BD Ultra-Fine Original Pen Needle 29 gauge x 1/2", Disp: , Rfl:  .  iron polysaccharides (NIFEREX) 150 MG capsule, Take 150 mg by mouth at bedtime., Disp: , Rfl:  .  Lancets (ONETOUCH ULTRASOFT) lancets, **SIGN AOB ** USE TO TEST BLOOD SUGAR BEFORE MEALS AT BEDTIME, Disp: , Rfl: 0 .  ONE TOUCH ULTRA TEST test strip, TAKE 1 STRIP(S) 4 TIMES A DAY DX CODE E11.65, Disp: , Rfl: 3 .  pantoprazole (PROTONIX) 40 MG tablet, Take 40 mg by mouth daily., Disp: , Rfl: 0 .  warfarin (COUMADIN) 5 MG tablet, warfarin 5 mg tablet  USE AS DIRECTED 5MG + 2MG = 7MG ON M/W/F ALTERNATE WITH 5MG + 3MG = 8MG ON TUES/ THURS/SAT/SUN, Disp: , Rfl:   Allergies  Allergen Reactions  . Morphine   . Morphine And Related Other (See Comments)    confusion  . Oxycodone Other (See Comments)    confusion  . Penicillins     Has patient had a PCN reaction causing immediate rash, facial/tongue/throat swelling, SOB or lightheadedness with hypotension: Unknown Has patient had a PCN reaction causing severe rash involving mucus membranes or skin necrosis: Unknown Has patient had a PCN reaction that required hospitalization: Unknown Has patient had a PCN reaction  occurring within the last 10 years: Unknown If all of the above answers are "NO", then may proceed with Cephalosporin use.    Objective:  Vascular Examination: Capillary refill time immediate x 10 digits  Dorsalis pedis faintly palpable bilaterally Posterior tibial pulses faintly palpable b/l  Digital hair absent x 10 digits  Skin temperature gradient WNL b/l  Dermatological Examination: Skin with normal turgor, texture and tone b/l  Toenails 1-5 b/l discolored, thick, dystrophic with subungual debris and pain with palpation to nailbeds due to thickness of nails.  Pincer nail deformity right second digit.  Hyperkeratotic lesion noted bilateral hallux with no erythema, no edema, no drainage, no flocculence noted.  Musculoskeletal: Muscle strength  5/5 to all LE muscle groups  No gross bony deformities b/l.  No pain, crepitus or joint limitation noted with ROM.   Neurological: Sensation intact on the left lower extremity and decreased on the right with 10 gram monofilament. Vibratory sensation intact.  Assessment: 1. Painful onychomycosis toenails 1-5 b/l  2.  Calluses bilateral hallux 3.  NIDDM with neuropathy  Plan: 1. Toenails 1-5 b/l were debrided in length and girth without iatrogenic bleeding. 2. Calluses pared bilateral great toenails without incident 3. Patient to continue soft, supportive shoe gear 4. Patient to report any pedal injuries to medical professional immediately. 5. Follow up 3 months. Patient/POA to call should there be a concern in the interim.

## 2018-05-10 DIAGNOSIS — N183 Chronic kidney disease, stage 3 (moderate): Secondary | ICD-10-CM | POA: Diagnosis not present

## 2018-05-10 DIAGNOSIS — N2581 Secondary hyperparathyroidism of renal origin: Secondary | ICD-10-CM | POA: Diagnosis not present

## 2018-05-10 DIAGNOSIS — D631 Anemia in chronic kidney disease: Secondary | ICD-10-CM | POA: Diagnosis not present

## 2018-05-10 DIAGNOSIS — Z6827 Body mass index (BMI) 27.0-27.9, adult: Secondary | ICD-10-CM | POA: Diagnosis not present

## 2018-05-10 DIAGNOSIS — I129 Hypertensive chronic kidney disease with stage 1 through stage 4 chronic kidney disease, or unspecified chronic kidney disease: Secondary | ICD-10-CM | POA: Diagnosis not present

## 2018-05-10 DIAGNOSIS — E1122 Type 2 diabetes mellitus with diabetic chronic kidney disease: Secondary | ICD-10-CM | POA: Diagnosis not present

## 2018-05-10 DIAGNOSIS — Z720 Tobacco use: Secondary | ICD-10-CM | POA: Diagnosis not present

## 2018-05-15 DIAGNOSIS — I480 Paroxysmal atrial fibrillation: Secondary | ICD-10-CM | POA: Diagnosis not present

## 2018-05-15 DIAGNOSIS — Z7901 Long term (current) use of anticoagulants: Secondary | ICD-10-CM | POA: Diagnosis not present

## 2018-05-23 DIAGNOSIS — Z7901 Long term (current) use of anticoagulants: Secondary | ICD-10-CM | POA: Diagnosis not present

## 2018-05-23 DIAGNOSIS — I480 Paroxysmal atrial fibrillation: Secondary | ICD-10-CM | POA: Diagnosis not present

## 2018-05-29 DIAGNOSIS — Z7901 Long term (current) use of anticoagulants: Secondary | ICD-10-CM | POA: Diagnosis not present

## 2018-05-29 DIAGNOSIS — I480 Paroxysmal atrial fibrillation: Secondary | ICD-10-CM | POA: Diagnosis not present

## 2018-06-03 DIAGNOSIS — Z7901 Long term (current) use of anticoagulants: Secondary | ICD-10-CM | POA: Diagnosis not present

## 2018-06-03 DIAGNOSIS — I480 Paroxysmal atrial fibrillation: Secondary | ICD-10-CM | POA: Diagnosis not present

## 2018-06-04 DIAGNOSIS — I739 Peripheral vascular disease, unspecified: Secondary | ICD-10-CM | POA: Diagnosis not present

## 2018-06-04 DIAGNOSIS — E1165 Type 2 diabetes mellitus with hyperglycemia: Secondary | ICD-10-CM | POA: Diagnosis not present

## 2018-06-12 DIAGNOSIS — Z7901 Long term (current) use of anticoagulants: Secondary | ICD-10-CM | POA: Diagnosis not present

## 2018-06-12 DIAGNOSIS — I480 Paroxysmal atrial fibrillation: Secondary | ICD-10-CM | POA: Diagnosis not present

## 2018-06-19 DIAGNOSIS — Z7901 Long term (current) use of anticoagulants: Secondary | ICD-10-CM | POA: Diagnosis not present

## 2018-06-21 ENCOUNTER — Ambulatory Visit (INDEPENDENT_AMBULATORY_CARE_PROVIDER_SITE_OTHER): Payer: Medicare Other | Admitting: Podiatry

## 2018-06-21 ENCOUNTER — Other Ambulatory Visit: Payer: Self-pay

## 2018-06-21 DIAGNOSIS — B351 Tinea unguium: Secondary | ICD-10-CM

## 2018-06-21 DIAGNOSIS — M79676 Pain in unspecified toe(s): Secondary | ICD-10-CM

## 2018-06-21 DIAGNOSIS — E1142 Type 2 diabetes mellitus with diabetic polyneuropathy: Secondary | ICD-10-CM

## 2018-06-21 DIAGNOSIS — L84 Corns and callosities: Secondary | ICD-10-CM | POA: Diagnosis not present

## 2018-06-21 NOTE — Patient Instructions (Signed)
Corns and Calluses Corns are small areas of thickened skin that occur on the top, sides, or tip of a toe. They contain a cone-shaped core with a point that can press on a nerve below. This causes pain.  Calluses are areas of thickened skin that can occur anywhere on the body, including the hands, fingers, palms, soles of the feet, and heels. Calluses are usually larger than corns. What are the causes? Corns and calluses are caused by rubbing (friction) or pressure, such as from shoes that are too tight or do not fit properly. What increases the risk? Corns are more likely to develop in people who have misshapen toes (toe deformities), such as hammer toes. Calluses can occur with friction to any area of the skin. They are more likely to develop in people who:  Work with their hands.  Wear shoes that fit poorly, are too tight, or are high-heeled.  Have toe deformities. What are the signs or symptoms? Symptoms of a corn or callus include:  A hard growth on the skin.  Pain or tenderness under the skin.  Redness and swelling.  Increased discomfort while wearing tight-fitting shoes, if your feet are affected. If a corn or callus becomes infected, symptoms may include:  Redness and swelling that gets worse.  Pain.  Fluid, blood, or pus draining from the corn or callus. How is this diagnosed? Corns and calluses may be diagnosed based on your symptoms, your medical history, and a physical exam. How is this treated? Treatment for corns and calluses may include:  Removing the cause of the friction or pressure. This may involve: ? Changing your shoes. ? Wearing shoe inserts (orthotics) or other protective layers in your shoes, such as a corn pad. ? Wearing gloves.  Applying medicine to the skin (topical medicine) to help soften skin in the hardened, thickened areas.  Removing layers of dead skin with a file to reduce the size of the corn or callus.  Removing the corn or callus with a  scalpel or laser.  Taking antibiotic medicines, if your corn or callus is infected.  Having surgery, if a toe deformity is the cause. Follow these instructions at home:   Take over-the-counter and prescription medicines only as told by your health care provider.  If you were prescribed an antibiotic, take it as told by your health care provider. Do not stop taking it even if your condition starts to improve.  Wear shoes that fit well. Avoid wearing high-heeled shoes and shoes that are too tight or too loose.  Wear any padding, protective layers, gloves, or orthotics as told by your health care provider.  Soak your hands or feet and then use a file or pumice stone to soften your corn or callus. Do this as told by your health care provider.  Check your corn or callus every day for symptoms of infection. Contact a health care provider if you:  Notice that your symptoms do not improve with treatment.  Have redness or swelling that gets worse.  Notice that your corn or callus becomes painful.  Have fluid, blood, or pus coming from your corn or callus.  Have new symptoms. Summary  Corns are small areas of thickened skin that occur on the top, sides, or tip of a toe.  Calluses are areas of thickened skin that can occur anywhere on the body, including the hands, fingers, palms, and soles of the feet. Calluses are usually larger than corns.  Corns and calluses are caused by   rubbing (friction) or pressure, such as from shoes that are too tight or do not fit properly.  Treatment may include wearing any padding, protective layers, gloves, or orthotics as told by your health care provider. This information is not intended to replace advice given to you by your health care provider. Make sure you discuss any questions you have with your health care provider. Document Released: 01/02/2004 Document Revised: 02/08/2017 Document Reviewed: 02/08/2017 Elsevier Interactive Patient Education   2019 [employer]  Onychomycosis/Fungal Toenails  WHAT IS IT? An infection that lies within the keratin of your nail plate that is caused by a fungus.  WHY ME? Fungal infections affect all ages, sexes, races, and creeds.  There may be many factors that predispose you to a fungal infection such as age, coexisting medical conditions such as diabetes, or an autoimmune disease; stress, medications, fatigue, genetics, etc.  Bottom line: fungus thrives in a warm, moist environment and your shoes offer such a location.  IS IT CONTAGIOUS? Theoretically, yes.  You do not want to share shoes, nail clippers or files with someone who has fungal toenails.  Walking around barefoot in the same room or sleeping in the same bed is unlikely to transfer the organism.  It is important to realize, however, that fungus can spread easily from one nail to the next on the same foot.  HOW DO WE TREAT THIS?  There are several ways to treat this condition.  Treatment may depend on many factors such as age, medications, pregnancy, liver and kidney conditions, etc.  It is best to ask your doctor which options are available to you.  1. No treatment.   Unlike many other medical concerns, you can live with this condition.  However for many people this can be a painful condition and may lead to ingrown toenails or a bacterial infection.  It is recommended that you keep the nails cut short to help reduce the amount of fungal nail. 2. Topical treatment.  These range from herbal remedies to prescription strength nail lacquers.  About 40-50% effective, topicals require twice daily application for approximately 9 to 12 months or until an entirely new nail has grown out.  The most effective topicals are medical grade medications available through physicians offices. 3. Oral antifungal medications.  With an 80-90% cure rate, the most common oral medication requires 3 to 4 months of therapy and stays in your system for a year as the new nail  grows out.  Oral antifungal medications do require blood work to make sure it is a safe drug for you.  A liver function panel will be performed prior to starting the medication and after the first month of treatment.  It is important to have the blood work performed to avoid any harmful side effects.  In general, this medication safe but blood work is required. 4. Laser Therapy.  This treatment is performed by applying a specialized laser to the affected nail plate.  This therapy is noninvasive, fast, and non-painful.  It is not covered by insurance and is therefore, out of pocket.  The results have been very good with a 80-95% cure rate.  [facility] is the only practice in the area to offer this therapy. 5. Permanent Nail Avulsion.  Removing the entire nail so that a new nail will not grow back. 

## 2018-07-02 ENCOUNTER — Encounter: Payer: Self-pay | Admitting: Podiatry

## 2018-07-02 NOTE — Progress Notes (Signed)
Subjective: Tracy Elliott presents with diabetes, on long term blood thinner, Coumadin, for follow up cc of painful, discolored, thick toenails and painful calluses which interfere with activities of daily living. Pain is aggravated when wearing enclosed shoe gear. Pain is relieved with periodic professional debridement.  Audley Hose, MD is her PCP.   Current Outpatient Medications:  .  aspirin 81 MG EC tablet, Take 81 mg by mouth daily., Disp: , Rfl: 0 .  ASPIRIN 81 PO, aspirin 81 mg tablet,delayed release  TAKE 1 TABLET BY MOUTH EVERY DAY, Disp: , Rfl:  .  atorvastatin (LIPITOR) 80 MG tablet, atorvastatin 80 mg tablet  Take 1 tablet(s) every day by oral route at bedtime for 90 days., Disp: , Rfl:  .  BD ULTRA-FINE PEN NEEDLES 29G X 12.7MM MISC, USE BEFORE MEALS AND AT BEDTIME, Disp: , Rfl: 0 .  Blood Glucose Monitoring Suppl (ONE TOUCH ULTRA 2) w/Device KIT, **SIGN AOB USE TO TEST BLOOD SUGAR FOUR TIMES DAILY, Disp: , Rfl: 0 .  carvedilol (COREG) 25 MG tablet, Take 12.5 mg by mouth 2 (two) times daily. , Disp: , Rfl: 0 .  clotrimazole (LOTRIMIN) 1 % cream, APPLY TO AFFECTED & SURROUNDING AREAS OF SKIN 2 TIMES DAILY IN THE MORNING AND EVENING, Disp: , Rfl: 3 .  diltiazem (CARDIZEM CD) 180 MG 24 hr capsule, Take 180 mg by mouth daily., Disp: , Rfl: 0 .  furosemide (LASIX) 40 MG tablet, Take 40 mg by mouth 2 (two) times daily., Disp: , Rfl:  .  glucose blood test strip, OneTouch Ultra Test strips  Take 1 strip(s) 4 times a day by miscell. route for 30 days., Disp: , Rfl:  .  hydrALAZINE (APRESOLINE) 50 MG tablet, Take 50 mg by mouth 2 (two) times daily. , Disp: , Rfl: 0 .  insulin aspart (NOVOLOG FLEXPEN) 100 UNIT/ML FlexPen, Inject 2-12 Units into the skin 4 (four) times daily -  with meals and at bedtime. Per sliding scale, Disp: , Rfl:  .  Insulin Glargine (BASAGLAR KWIKPEN) 100 UNIT/ML SOPN, Basaglar KwikPen U-100 Insulin 100 unit/mL (3 mL) subcutaneous  Inject 24 unit(s) every day by  subcutaneous route in the morning for 30 days., Disp: , Rfl:  .  Insulin Pen Needle 29G X 12.7MM MISC, BD Ultra-Fine Original Pen Needle 29 gauge x 1/2", Disp: , Rfl:  .  iron polysaccharides (NIFEREX) 150 MG capsule, Take 150 mg by mouth at bedtime., Disp: , Rfl:  .  Lancets (ONETOUCH ULTRASOFT) lancets, **SIGN AOB ** USE TO TEST BLOOD SUGAR BEFORE MEALS AT BEDTIME, Disp: , Rfl: 0 .  ONE TOUCH ULTRA TEST test strip, TAKE 1 STRIP(S) 4 TIMES A DAY DX CODE E11.65, Disp: , Rfl: 3 .  pantoprazole (PROTONIX) 40 MG tablet, Take 40 mg by mouth daily., Disp: , Rfl: 0 .  warfarin (COUMADIN) 5 MG tablet, warfarin 5 mg tablet  USE AS DIRECTED 5MG + 2MG = 7MG ON M/W/F ALTERNATE WITH 5MG + 3MG = 8MG ON TUES/ THURS/SAT/SUN, Disp: , Rfl:   Allergies  Allergen Reactions  . Morphine   . Morphine And Related Other (See Comments)    confusion  . Oxycodone Other (See Comments)    confusion  . Penicillins     Has patient had a PCN reaction causing immediate rash, facial/tongue/throat swelling, SOB or lightheadedness with hypotension: Unknown Has patient had a PCN reaction causing severe rash involving mucus membranes or skin necrosis: Unknown Has patient had a PCN reaction that required  hospitalization: Unknown Has patient had a PCN reaction occurring within the last 10 years: Unknown If all of the above answers are "NO", then may proceed with Cephalosporin use.    Vascular Examination: Capillary refill time immediate x 10 digits.  Dorsalis pedis pulses faintly palpable b/l.  Posterior tibial pulses faintly palpable b/l.  Dgital hair absent x 10 digits.  Skin temperature gradient absent b/l.  Dermatological Examination: Skin with normal turgor, texture and tone b/l.  Toenails 1-5 b/l discolored, thick, dystrophic with subungual debris and pain with palpation to nailbeds due to thickness of nails.   Pincer nail deformity right 2nd digit.  Hyperkeratotic lesion bilateral hallux. No erythema, no  edema, no drainage, no flocculence noted.   Musculoskeletal: Muscle strength 5/5 to all LE muscle groups  Neurological: Sensation diminished with 10 gram monofilament RLE and intact LLE.  Assessment: 1. Painful onychomycosis toenails 1-5 b/l 2. Calluses b/l hallux 3. NIDDM with Diabetic neuropathy 4. Patient on long term blood thinner  Plan: 1. Continue diabetic foot care principles. Literature dispensed on today. 2. Toenails 1-5 b/l were debrided in length and girth without iatrogenic bleeding. 3. Calluses pared b/l hallux utilizing sterile scalpel blade without incident.  4. Patient to continue soft, supportive shoe gear. 5. Patient to report any pedal injuries to medical professional. 6. Follow up 3 months.  7. Patient/POA to call should there be a concern in the interim.

## 2018-07-05 ENCOUNTER — Ambulatory Visit: Payer: Medicare Other | Admitting: Podiatry

## 2018-07-10 DIAGNOSIS — Z7901 Long term (current) use of anticoagulants: Secondary | ICD-10-CM | POA: Diagnosis not present

## 2018-07-10 DIAGNOSIS — I480 Paroxysmal atrial fibrillation: Secondary | ICD-10-CM | POA: Diagnosis not present

## 2018-07-11 DIAGNOSIS — Z7901 Long term (current) use of anticoagulants: Secondary | ICD-10-CM | POA: Diagnosis not present

## 2018-07-13 DIAGNOSIS — I10 Essential (primary) hypertension: Secondary | ICD-10-CM | POA: Diagnosis not present

## 2018-07-13 DIAGNOSIS — D509 Iron deficiency anemia, unspecified: Secondary | ICD-10-CM | POA: Diagnosis not present

## 2018-07-13 DIAGNOSIS — Z7901 Long term (current) use of anticoagulants: Secondary | ICD-10-CM | POA: Diagnosis not present

## 2018-07-13 DIAGNOSIS — E1165 Type 2 diabetes mellitus with hyperglycemia: Secondary | ICD-10-CM | POA: Diagnosis not present

## 2018-07-13 DIAGNOSIS — I4891 Unspecified atrial fibrillation: Secondary | ICD-10-CM | POA: Diagnosis not present

## 2018-08-07 DIAGNOSIS — Z7901 Long term (current) use of anticoagulants: Secondary | ICD-10-CM | POA: Diagnosis not present

## 2018-08-07 DIAGNOSIS — I480 Paroxysmal atrial fibrillation: Secondary | ICD-10-CM | POA: Diagnosis not present

## 2018-08-09 DIAGNOSIS — Z6827 Body mass index (BMI) 27.0-27.9, adult: Secondary | ICD-10-CM | POA: Diagnosis not present

## 2018-08-09 DIAGNOSIS — N2581 Secondary hyperparathyroidism of renal origin: Secondary | ICD-10-CM | POA: Diagnosis not present

## 2018-08-09 DIAGNOSIS — N183 Chronic kidney disease, stage 3 (moderate): Secondary | ICD-10-CM | POA: Diagnosis not present

## 2018-08-09 DIAGNOSIS — E1122 Type 2 diabetes mellitus with diabetic chronic kidney disease: Secondary | ICD-10-CM | POA: Diagnosis not present

## 2018-08-09 DIAGNOSIS — Z720 Tobacco use: Secondary | ICD-10-CM | POA: Diagnosis not present

## 2018-08-09 DIAGNOSIS — D631 Anemia in chronic kidney disease: Secondary | ICD-10-CM | POA: Diagnosis not present

## 2018-08-09 DIAGNOSIS — I129 Hypertensive chronic kidney disease with stage 1 through stage 4 chronic kidney disease, or unspecified chronic kidney disease: Secondary | ICD-10-CM | POA: Diagnosis not present

## 2018-08-10 DIAGNOSIS — Z7901 Long term (current) use of anticoagulants: Secondary | ICD-10-CM | POA: Diagnosis not present

## 2018-08-29 DIAGNOSIS — Z7901 Long term (current) use of anticoagulants: Secondary | ICD-10-CM | POA: Diagnosis not present

## 2018-09-04 DIAGNOSIS — I480 Paroxysmal atrial fibrillation: Secondary | ICD-10-CM | POA: Diagnosis not present

## 2018-09-04 DIAGNOSIS — Z7901 Long term (current) use of anticoagulants: Secondary | ICD-10-CM | POA: Diagnosis not present

## 2018-09-27 ENCOUNTER — Ambulatory Visit: Payer: Medicare Other | Admitting: Podiatry

## 2018-10-03 DIAGNOSIS — I480 Paroxysmal atrial fibrillation: Secondary | ICD-10-CM | POA: Diagnosis not present

## 2018-10-03 DIAGNOSIS — Z7901 Long term (current) use of anticoagulants: Secondary | ICD-10-CM | POA: Diagnosis not present

## 2018-10-22 ENCOUNTER — Ambulatory Visit: Payer: Medicare Other | Admitting: Podiatry

## 2018-10-22 ENCOUNTER — Encounter (INDEPENDENT_AMBULATORY_CARE_PROVIDER_SITE_OTHER): Payer: Medicare Other | Admitting: Ophthalmology

## 2018-11-05 DIAGNOSIS — Z720 Tobacco use: Secondary | ICD-10-CM | POA: Diagnosis not present

## 2018-11-05 DIAGNOSIS — I129 Hypertensive chronic kidney disease with stage 1 through stage 4 chronic kidney disease, or unspecified chronic kidney disease: Secondary | ICD-10-CM | POA: Diagnosis not present

## 2018-11-05 DIAGNOSIS — I480 Paroxysmal atrial fibrillation: Secondary | ICD-10-CM | POA: Diagnosis not present

## 2018-11-05 DIAGNOSIS — N183 Chronic kidney disease, stage 3 (moderate): Secondary | ICD-10-CM | POA: Diagnosis not present

## 2018-11-05 DIAGNOSIS — N2581 Secondary hyperparathyroidism of renal origin: Secondary | ICD-10-CM | POA: Diagnosis not present

## 2018-11-05 DIAGNOSIS — Z7901 Long term (current) use of anticoagulants: Secondary | ICD-10-CM | POA: Diagnosis not present

## 2018-11-05 DIAGNOSIS — E1122 Type 2 diabetes mellitus with diabetic chronic kidney disease: Secondary | ICD-10-CM | POA: Diagnosis not present

## 2018-11-05 DIAGNOSIS — D631 Anemia in chronic kidney disease: Secondary | ICD-10-CM | POA: Diagnosis not present

## 2018-11-05 DIAGNOSIS — Z6827 Body mass index (BMI) 27.0-27.9, adult: Secondary | ICD-10-CM | POA: Diagnosis not present

## 2018-11-07 ENCOUNTER — Other Ambulatory Visit: Payer: Self-pay

## 2018-11-07 ENCOUNTER — Ambulatory Visit (INDEPENDENT_AMBULATORY_CARE_PROVIDER_SITE_OTHER): Payer: Medicare Other | Admitting: Podiatry

## 2018-11-07 ENCOUNTER — Encounter: Payer: Self-pay | Admitting: Podiatry

## 2018-11-07 VITALS — Temp 97.6°F

## 2018-11-07 DIAGNOSIS — M79676 Pain in unspecified toe(s): Secondary | ICD-10-CM

## 2018-11-07 DIAGNOSIS — E1142 Type 2 diabetes mellitus with diabetic polyneuropathy: Secondary | ICD-10-CM | POA: Diagnosis not present

## 2018-11-07 DIAGNOSIS — B351 Tinea unguium: Secondary | ICD-10-CM

## 2018-11-07 DIAGNOSIS — L84 Corns and callosities: Secondary | ICD-10-CM

## 2018-11-07 NOTE — Patient Instructions (Addendum)
Diabetes Mellitus and Foot Care Foot care is an important part of your health, especially when you have diabetes. Diabetes may cause you to have problems because of poor blood flow (circulation) to your feet and legs, which can cause your skin to:  Become thinner and drier.  Break more easily.  Heal more slowly.  Peel and crack. You may also have nerve damage (neuropathy) in your legs and feet, causing decreased feeling in them. This means that you may not notice minor injuries to your feet that could lead to more serious problems. Noticing and addressing any potential problems early is the best way to prevent future foot problems. How to care for your feet Foot hygiene  Wash your feet daily with warm water and mild soap. Do not use hot water. Then, pat your feet and the areas between your toes until they are completely dry. Do not soak your feet as this can dry your skin.  Trim your toenails straight across. Do not dig under them or around the cuticle. File the edges of your nails with an emery board or nail file.  Apply a moisturizing lotion or petroleum jelly to the skin on your feet and to dry, brittle toenails. Use lotion that does not contain alcohol and is unscented. Do not apply lotion between your toes. Shoes and socks  Wear clean socks or stockings every day. Make sure they are not too tight. Do not wear knee-high stockings since they may decrease blood flow to your legs.  Wear shoes that fit properly and have enough cushioning. Always look in your shoes before you put them on to be sure there are no objects inside.  To break in new shoes, wear them for just a few hours a day. This prevents injuries on your feet. Wounds, scrapes, corns, and calluses  Check your feet daily for blisters, cuts, bruises, sores, and redness. If you cannot see the bottom of your feet, use a mirror or ask someone for help.  Do not cut corns or calluses or try to remove them with medicine.  If you  find a minor scrape, cut, or break in the skin on your feet, keep it and the skin around it clean and dry. You may clean these areas with mild soap and water. Do not clean the area with peroxide, alcohol, or iodine.  If you have a wound, scrape, corn, or callus on your foot, look at it several times a day to make sure it is healing and not infected. Check for: ? Redness, swelling, or pain. ? Fluid or blood. ? Warmth. ? Pus or a bad smell. General instructions  Do not cross your legs. This may decrease blood flow to your feet.  Do not use heating pads or hot water bottles on your feet. They may burn your skin. If you have lost feeling in your feet or legs, you may not know this is happening until it is too late.  Protect your feet from hot and cold by wearing shoes, such as at the beach or on hot pavement.  Schedule a complete foot exam at least once a year (annually) or more often if you have foot problems. If you have foot problems, report any cuts, sores, or bruises to your health care provider immediately. Contact a health care provider if:  You have a medical condition that increases your risk of infection and you have any cuts, sores, or bruises on your feet.  You have an injury that is not   healing.  You have redness on your legs or feet.  You feel burning or tingling in your legs or feet.  You have pain or cramps in your legs and feet.  Your legs or feet are numb.  Your feet always feel cold.  You have pain around a toenail. Get help right away if:  You have a wound, scrape, corn, or callus on your foot and: ? You have pain, swelling, or redness that gets worse. ? You have fluid or blood coming from the wound, scrape, corn, or callus. ? Your wound, scrape, corn, or callus feels warm to the touch. ? You have pus or a bad smell coming from the wound, scrape, corn, or callus. ? You have a fever. ? You have a red line going up your leg. Summary  Check your feet every day  for cuts, sores, red spots, swelling, and blisters.  Moisturize feet and legs daily.  Wear shoes that fit properly and have enough cushioning.  If you have foot problems, report any cuts, sores, or bruises to your health care provider immediately.  Schedule a complete foot exam at least once a year (annually) or more often if you have foot problems. This information is not intended to replace advice given to you by your health care provider. Make sure you discuss any questions you have with your health care provider. Document Released: 03/25/2000 Document Revised: 05/10/2017 Document Reviewed: 04/29/2016 Elsevier Patient Education  2020 [employer]   Onychomycosis/Fungal Toenails  WHAT IS IT? An infection that lies within the keratin of your nail plate that is caused by a fungus.  WHY ME? Fungal infections affect all ages, sexes, races, and creeds.  There may be many factors that predispose you to a fungal infection such as age, coexisting medical conditions such as diabetes, or an autoimmune disease; stress, medications, fatigue, genetics, etc.  Bottom line: fungus thrives in a warm, moist environment and your shoes offer such a location.  IS IT CONTAGIOUS? Theoretically, yes.  You do not want to share shoes, nail clippers or files with someone who has fungal toenails.  Walking around barefoot in the same room or sleeping in the same bed is unlikely to transfer the organism.  It is important to realize, however, that fungus can spread easily from one nail to the next on the same foot.  HOW DO WE TREAT THIS?  There are several ways to treat this condition.  Treatment may depend on many factors such as age, medications, pregnancy, liver and kidney conditions, etc.  It is best to ask your doctor which options are available to you.  1. No treatment.   Unlike many other medical concerns, you can live with this condition.  However for many people this can be a painful condition and may lead to  ingrown toenails or a bacterial infection.  It is recommended that you keep the nails cut short to help reduce the amount of fungal nail. 2. Topical treatment.  These range from herbal remedies to prescription strength nail lacquers.  About 40-50% effective, topicals require twice daily application for approximately 9 to 12 months or until an entirely new nail has grown out.  The most effective topicals are medical grade medications available through physicians offices. 3. Oral antifungal medications.  With an 80-90% cure rate, the most common oral medication requires 3 to 4 months of therapy and stays in your system for a year as the new nail grows out.  Oral antifungal medications do require   blood work to make sure it is a safe drug for you.  A liver function panel will be performed prior to starting the medication and after the first month of treatment.  It is important to have the blood work performed to avoid any harmful side effects.  In general, this medication safe but blood work is required. 4. Laser Therapy.  This treatment is performed by applying a specialized laser to the affected nail plate.  This therapy is noninvasive, fast, and non-painful.  It is not covered by insurance and is therefore, out of pocket.  The results have been very good with a 80-95% cure rate.  [facility] is the only practice in the area to offer this therapy. 5. Permanent Nail Avulsion.  Removing the entire nail so that a new nail will not grow back. 

## 2018-11-09 NOTE — Progress Notes (Signed)
Subjective:  Tracy Elliott presents to clinic today for preventative diabetic foot care with cc of  painful, thick, discolored, elongated toenails 1-5 b/l that become tender and cannot cut because of thickness.  Pain is aggravated when wearing enclosed shoe gear.  Her niece is present during the visit.   Ms. Kovich requests that her right 2nd digit and right 5th digit toenails not be cut today.   Audley Hose, MD is her PCP. Last visit was 08/10/2018.   Current Outpatient Medications:  .  aspirin 81 MG EC tablet, Take 81 mg by mouth daily., Disp: , Rfl: 0 .  ASPIRIN 81 PO, aspirin 81 mg tablet,delayed release  TAKE 1 TABLET BY MOUTH EVERY DAY, Disp: , Rfl:  .  atorvastatin (LIPITOR) 80 MG tablet, atorvastatin 80 mg tablet  Take 1 tablet(s) every day by oral route at bedtime for 90 days., Disp: , Rfl:  .  BD ULTRA-FINE PEN NEEDLES 29G X 12.7MM MISC, USE BEFORE MEALS AND AT BEDTIME, Disp: , Rfl: 0 .  Blood Glucose Monitoring Suppl (ONE TOUCH ULTRA 2) w/Device KIT, **SIGN AOB USE TO TEST BLOOD SUGAR FOUR TIMES DAILY, Disp: , Rfl: 0 .  carvedilol (COREG) 25 MG tablet, Take 12.5 mg by mouth 2 (two) times daily. , Disp: , Rfl: 0 .  clotrimazole (LOTRIMIN) 1 % cream, APPLY TO AFFECTED & SURROUNDING AREAS OF SKIN 2 TIMES DAILY IN THE MORNING AND EVENING, Disp: , Rfl: 3 .  diltiazem (CARDIZEM CD) 180 MG 24 hr capsule, Take 180 mg by mouth daily., Disp: , Rfl: 0 .  furosemide (LASIX) 40 MG tablet, Take 40 mg by mouth 2 (two) times daily., Disp: , Rfl:  .  glucose blood test strip, OneTouch Ultra Test strips  Take 1 strip(s) 4 times a day by miscell. route for 30 days., Disp: , Rfl:  .  hydrALAZINE (APRESOLINE) 50 MG tablet, Take 50 mg by mouth 2 (two) times daily. , Disp: , Rfl: 0 .  insulin aspart (NOVOLOG FLEXPEN) 100 UNIT/ML FlexPen, Inject 2-12 Units into the skin 4 (four) times daily -  with meals and at bedtime. Per sliding scale, Disp: , Rfl:  .  Insulin Glargine (BASAGLAR KWIKPEN) 100  UNIT/ML SOPN, Basaglar KwikPen U-100 Insulin 100 unit/mL (3 mL) subcutaneous  Inject 24 unit(s) every day by subcutaneous route in the morning for 30 days., Disp: , Rfl:  .  Insulin Pen Needle 29G X 12.7MM MISC, BD Ultra-Fine Original Pen Needle 29 gauge x 1/2", Disp: , Rfl:  .  iron polysaccharides (NIFEREX) 150 MG capsule, Take 150 mg by mouth at bedtime., Disp: , Rfl:  .  Lancets (ONETOUCH ULTRASOFT) lancets, **SIGN AOB ** USE TO TEST BLOOD SUGAR BEFORE MEALS AT BEDTIME, Disp: , Rfl: 0 .  ONE TOUCH ULTRA TEST test strip, TAKE 1 STRIP(S) 4 TIMES A DAY DX CODE E11.65, Disp: , Rfl: 3 .  pantoprazole (PROTONIX) 40 MG tablet, Take 40 mg by mouth daily., Disp: , Rfl: 0 .  warfarin (COUMADIN) 5 MG tablet, warfarin 5 mg tablet  USE AS DIRECTED 5MG + 2MG = 7MG ON M/W/F ALTERNATE WITH 5MG + 3MG = 8MG ON TUES/ THURS/SAT/SUN, Disp: , Rfl:    Allergies  Allergen Reactions  . Morphine   . Morphine And Related Other (See Comments)    confusion  . Oxycodone Other (See Comments)    confusion  . Penicillins     Has patient had a PCN reaction causing immediate rash, facial/tongue/throat swelling, SOB  or lightheadedness with hypotension: Unknown Has patient had a PCN reaction causing severe rash involving mucus membranes or skin necrosis: Unknown Has patient had a PCN reaction that required hospitalization: Unknown Has patient had a PCN reaction occurring within the last 10 years: Unknown If all of the above answers are "NO", then may proceed with Cephalosporin use.     Objective: Vitals:   11/07/18 1625  Temp: 97.6 F (36.4 C)    Physical Examination:  Vascular Examination: Capillary refill time immediate x 10 digits.  Faintly palpable DP/PT pulses b/l.  Digital hair absent b/l.  No edema noted b/l.  Skin temperature gradient WNL b/l.  Dermatological Examination: Skin with normal turgor, texture and tone b/l.  No open wounds b/l.  No interdigital macerations noted b/l.  Elongated,  thick, discolored brittle toenails with subungual debris and pain on dorsal palpation of nailbeds 1-5 b/l.  Pincer nail deformity right 2nd digit.   Hyperkeratotic lesions b/l hallux with tenderness to palpation. No edema, no erythema, no drainage, no flocculence.  Musculoskeletal Examination: Muscle strength 5/5 to all muscle groups b/l  No pain, crepitus or joint discomfort with active/passive ROM.  Neurological Examination: Sensation intact diminished RLE with 10 gram monofilament; intact 5/5 LLE.   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 Toenails 1-5 left, 1, 3, 4 right  were debrided in length and girth without iatrogenic laceration. Right 2nd and 5th digits not debrided per patient request. 2.  Continue soft, supportive shoe gear daily. 3.  Report any pedal injuries to medical professional. 4.  Follow up 3 months. 5.  Patient/POA to call should there be a question/concern in there interim.

## 2018-11-14 ENCOUNTER — Encounter (INDEPENDENT_AMBULATORY_CARE_PROVIDER_SITE_OTHER): Payer: Medicare Other | Admitting: Ophthalmology

## 2018-11-14 ENCOUNTER — Other Ambulatory Visit: Payer: Self-pay

## 2018-11-14 DIAGNOSIS — E113593 Type 2 diabetes mellitus with proliferative diabetic retinopathy without macular edema, bilateral: Secondary | ICD-10-CM

## 2018-11-14 DIAGNOSIS — E11319 Type 2 diabetes mellitus with unspecified diabetic retinopathy without macular edema: Secondary | ICD-10-CM | POA: Diagnosis not present

## 2018-11-14 DIAGNOSIS — H35033 Hypertensive retinopathy, bilateral: Secondary | ICD-10-CM | POA: Diagnosis not present

## 2018-11-14 DIAGNOSIS — I10 Essential (primary) hypertension: Secondary | ICD-10-CM | POA: Diagnosis not present

## 2018-11-14 DIAGNOSIS — H43813 Vitreous degeneration, bilateral: Secondary | ICD-10-CM | POA: Diagnosis not present

## 2018-11-15 DIAGNOSIS — H43393 Other vitreous opacities, bilateral: Secondary | ICD-10-CM | POA: Diagnosis not present

## 2018-11-15 DIAGNOSIS — H00022 Hordeolum internum right lower eyelid: Secondary | ICD-10-CM | POA: Diagnosis not present

## 2018-11-15 DIAGNOSIS — H00025 Hordeolum internum left lower eyelid: Secondary | ICD-10-CM | POA: Diagnosis not present

## 2018-11-15 DIAGNOSIS — H3411 Central retinal artery occlusion, right eye: Secondary | ICD-10-CM | POA: Diagnosis not present

## 2018-11-15 DIAGNOSIS — Z961 Presence of intraocular lens: Secondary | ICD-10-CM | POA: Diagnosis not present

## 2018-11-15 DIAGNOSIS — H35033 Hypertensive retinopathy, bilateral: Secondary | ICD-10-CM | POA: Diagnosis not present

## 2018-11-15 DIAGNOSIS — E113593 Type 2 diabetes mellitus with proliferative diabetic retinopathy without macular edema, bilateral: Secondary | ICD-10-CM | POA: Diagnosis not present

## 2018-12-03 DIAGNOSIS — I480 Paroxysmal atrial fibrillation: Secondary | ICD-10-CM | POA: Diagnosis not present

## 2018-12-03 DIAGNOSIS — Z7901 Long term (current) use of anticoagulants: Secondary | ICD-10-CM | POA: Diagnosis not present

## 2018-12-06 DIAGNOSIS — Z0001 Encounter for general adult medical examination with abnormal findings: Secondary | ICD-10-CM | POA: Diagnosis not present

## 2018-12-06 DIAGNOSIS — I4510 Unspecified right bundle-branch block: Secondary | ICD-10-CM | POA: Insufficient documentation

## 2018-12-06 DIAGNOSIS — I739 Peripheral vascular disease, unspecified: Secondary | ICD-10-CM | POA: Diagnosis not present

## 2018-12-06 DIAGNOSIS — Z23 Encounter for immunization: Secondary | ICD-10-CM | POA: Diagnosis not present

## 2018-12-06 DIAGNOSIS — D509 Iron deficiency anemia, unspecified: Secondary | ICD-10-CM | POA: Diagnosis not present

## 2018-12-06 DIAGNOSIS — R4189 Other symptoms and signs involving cognitive functions and awareness: Secondary | ICD-10-CM | POA: Diagnosis not present

## 2018-12-06 DIAGNOSIS — K219 Gastro-esophageal reflux disease without esophagitis: Secondary | ICD-10-CM | POA: Diagnosis not present

## 2018-12-06 DIAGNOSIS — I10 Essential (primary) hypertension: Secondary | ICD-10-CM | POA: Diagnosis not present

## 2018-12-06 DIAGNOSIS — E1165 Type 2 diabetes mellitus with hyperglycemia: Secondary | ICD-10-CM | POA: Diagnosis not present

## 2018-12-06 DIAGNOSIS — I4891 Unspecified atrial fibrillation: Secondary | ICD-10-CM | POA: Diagnosis not present

## 2018-12-06 DIAGNOSIS — Z13820 Encounter for screening for osteoporosis: Secondary | ICD-10-CM | POA: Diagnosis not present

## 2018-12-06 DIAGNOSIS — Z1231 Encounter for screening mammogram for malignant neoplasm of breast: Secondary | ICD-10-CM | POA: Diagnosis not present

## 2018-12-06 DIAGNOSIS — Z1211 Encounter for screening for malignant neoplasm of colon: Secondary | ICD-10-CM | POA: Diagnosis not present

## 2018-12-11 DIAGNOSIS — Z7901 Long term (current) use of anticoagulants: Secondary | ICD-10-CM | POA: Diagnosis not present

## 2018-12-12 DIAGNOSIS — E1165 Type 2 diabetes mellitus with hyperglycemia: Secondary | ICD-10-CM | POA: Diagnosis not present

## 2018-12-12 DIAGNOSIS — E785 Hyperlipidemia, unspecified: Secondary | ICD-10-CM | POA: Diagnosis not present

## 2018-12-30 DIAGNOSIS — I480 Paroxysmal atrial fibrillation: Secondary | ICD-10-CM | POA: Diagnosis not present

## 2018-12-30 DIAGNOSIS — Z7901 Long term (current) use of anticoagulants: Secondary | ICD-10-CM | POA: Diagnosis not present

## 2019-01-11 DIAGNOSIS — Z7901 Long term (current) use of anticoagulants: Secondary | ICD-10-CM | POA: Diagnosis not present

## 2019-01-31 DIAGNOSIS — I480 Paroxysmal atrial fibrillation: Secondary | ICD-10-CM | POA: Diagnosis not present

## 2019-01-31 DIAGNOSIS — Z7901 Long term (current) use of anticoagulants: Secondary | ICD-10-CM | POA: Diagnosis not present

## 2019-02-04 DIAGNOSIS — H43393 Other vitreous opacities, bilateral: Secondary | ICD-10-CM | POA: Diagnosis not present

## 2019-02-04 DIAGNOSIS — H01003 Unspecified blepharitis right eye, unspecified eyelid: Secondary | ICD-10-CM | POA: Diagnosis not present

## 2019-02-04 DIAGNOSIS — Z961 Presence of intraocular lens: Secondary | ICD-10-CM | POA: Diagnosis not present

## 2019-02-04 DIAGNOSIS — H00025 Hordeolum internum left lower eyelid: Secondary | ICD-10-CM | POA: Diagnosis not present

## 2019-02-04 DIAGNOSIS — H0012 Chalazion right lower eyelid: Secondary | ICD-10-CM | POA: Diagnosis not present

## 2019-02-04 DIAGNOSIS — E113593 Type 2 diabetes mellitus with proliferative diabetic retinopathy without macular edema, bilateral: Secondary | ICD-10-CM | POA: Diagnosis not present

## 2019-02-04 DIAGNOSIS — H3411 Central retinal artery occlusion, right eye: Secondary | ICD-10-CM | POA: Diagnosis not present

## 2019-02-04 DIAGNOSIS — H01006 Unspecified blepharitis left eye, unspecified eyelid: Secondary | ICD-10-CM | POA: Diagnosis not present

## 2019-02-04 DIAGNOSIS — H35033 Hypertensive retinopathy, bilateral: Secondary | ICD-10-CM | POA: Diagnosis not present

## 2019-02-04 DIAGNOSIS — Z9889 Other specified postprocedural states: Secondary | ICD-10-CM | POA: Diagnosis not present

## 2019-02-05 DIAGNOSIS — Z7901 Long term (current) use of anticoagulants: Secondary | ICD-10-CM | POA: Diagnosis not present

## 2019-02-06 ENCOUNTER — Ambulatory Visit (INDEPENDENT_AMBULATORY_CARE_PROVIDER_SITE_OTHER): Payer: Medicare Other | Admitting: Podiatry

## 2019-02-06 ENCOUNTER — Encounter: Payer: Self-pay | Admitting: Podiatry

## 2019-02-06 ENCOUNTER — Other Ambulatory Visit: Payer: Self-pay

## 2019-02-06 DIAGNOSIS — L84 Corns and callosities: Secondary | ICD-10-CM | POA: Diagnosis not present

## 2019-02-06 DIAGNOSIS — E1142 Type 2 diabetes mellitus with diabetic polyneuropathy: Secondary | ICD-10-CM | POA: Diagnosis not present

## 2019-02-06 DIAGNOSIS — B351 Tinea unguium: Secondary | ICD-10-CM | POA: Diagnosis not present

## 2019-02-06 DIAGNOSIS — M79676 Pain in unspecified toe(s): Secondary | ICD-10-CM | POA: Diagnosis not present

## 2019-02-06 NOTE — Patient Instructions (Signed)
Diabetes Mellitus and Foot Care Foot care is an important part of your health, especially when you have diabetes. Diabetes may cause you to have problems because of poor blood flow (circulation) to your feet and legs, which can cause your skin to:  Become thinner and drier.  Break more easily.  Heal more slowly.  Peel and crack. You may also have nerve damage (neuropathy) in your legs and feet, causing decreased feeling in them. This means that you may not notice minor injuries to your feet that could lead to more serious problems. Noticing and addressing any potential problems early is the best way to prevent future foot problems. How to care for your feet Foot hygiene  Wash your feet daily with warm water and mild soap. Do not use hot water. Then, pat your feet and the areas between your toes until they are completely dry. Do not soak your feet as this can dry your skin.  Trim your toenails straight across. Do not dig under them or around the cuticle. File the edges of your nails with an emery board or nail file.  Apply a moisturizing lotion or petroleum jelly to the skin on your feet and to dry, brittle toenails. Use lotion that does not contain alcohol and is unscented. Do not apply lotion between your toes. Shoes and socks  Wear clean socks or stockings every day. Make sure they are not too tight. Do not wear knee-high stockings since they may decrease blood flow to your legs.  Wear shoes that fit properly and have enough cushioning. Always look in your shoes before you put them on to be sure there are no objects inside.  To break in new shoes, wear them for just a few hours a day. This prevents injuries on your feet. Wounds, scrapes, corns, and calluses  Check your feet daily for blisters, cuts, bruises, sores, and redness. If you cannot see the bottom of your feet, use a mirror or ask someone for help.  Do not cut corns or calluses or try to remove them with medicine.  If you  find a minor scrape, cut, or break in the skin on your feet, keep it and the skin around it clean and dry. You may clean these areas with mild soap and water. Do not clean the area with peroxide, alcohol, or iodine.  If you have a wound, scrape, corn, or callus on your foot, look at it several times a day to make sure it is healing and not infected. Check for: ? Redness, swelling, or pain. ? Fluid or blood. ? Warmth. ? Pus or a bad smell. General instructions  Do not cross your legs. This may decrease blood flow to your feet.  Do not use heating pads or hot water bottles on your feet. They may burn your skin. If you have lost feeling in your feet or legs, you may not know this is happening until it is too late.  Protect your feet from hot and cold by wearing shoes, such as at the beach or on hot pavement.  Schedule a complete foot exam at least once a year (annually) or more often if you have foot problems. If you have foot problems, report any cuts, sores, or bruises to your health care provider immediately. Contact a health care provider if:  You have a medical condition that increases your risk of infection and you have any cuts, sores, or bruises on your feet.  You have an injury that is not   healing.  You have redness on your legs or feet.  You feel burning or tingling in your legs or feet.  You have pain or cramps in your legs and feet.  Your legs or feet are numb.  Your feet always feel cold.  You have pain around a toenail. Get help right away if:  You have a wound, scrape, corn, or callus on your foot and: ? You have pain, swelling, or redness that gets worse. ? You have fluid or blood coming from the wound, scrape, corn, or callus. ? Your wound, scrape, corn, or callus feels warm to the touch. ? You have pus or a bad smell coming from the wound, scrape, corn, or callus. ? You have a fever. ? You have a red line going up your leg. Summary  Check your feet every day  for cuts, sores, red spots, swelling, and blisters.  Moisturize feet and legs daily.  Wear shoes that fit properly and have enough cushioning.  If you have foot problems, report any cuts, sores, or bruises to your health care provider immediately.  Schedule a complete foot exam at least once a year (annually) or more often if you have foot problems. This information is not intended to replace advice given to you by your health care provider. Make sure you discuss any questions you have with your health care provider. Document Released: 03/25/2000 Document Revised: 05/10/2017 Document Reviewed: 04/29/2016 Elsevier Patient Education  2020 [employer]  

## 2019-02-09 NOTE — Progress Notes (Signed)
Subjective: Tracy Elliott presents today with history of diabetic neuropathy. Patient seen for follow up of b/l hallux and chronic, painful mycotic toenails which interfere with daily activities and routine tasks.  Pain is aggravated when wearing enclosed shoe gear. Pain is getting progressively worse and relieved with periodic professional debridement.   She voices no new pedal concerns on today's visit.  Tracy Hose, MD is her PCP. Last visit was 10/03/2018.  Current Outpatient Medications on File Prior to Visit  Medication Sig Dispense Refill  . aspirin 81 MG EC tablet Take 81 mg by mouth daily.  0  . ASPIRIN 81 PO aspirin 81 mg tablet,delayed release  TAKE 1 TABLET BY MOUTH EVERY DAY    . atorvastatin (LIPITOR) 80 MG tablet atorvastatin 80 mg tablet  Take 1 tablet(s) every day by oral route at bedtime for 90 days.    . BD ULTRA-FINE PEN NEEDLES 29G X 12.7MM MISC USE BEFORE MEALS AND AT BEDTIME  0  . Blood Glucose Monitoring Suppl (ONE TOUCH ULTRA 2) w/Device KIT **SIGN AOB USE TO TEST BLOOD SUGAR FOUR TIMES DAILY  0  . carvedilol (COREG) 25 MG tablet Take 12.5 mg by mouth 2 (two) times daily.   0  . clotrimazole (LOTRIMIN) 1 % cream APPLY TO AFFECTED & SURROUNDING AREAS OF SKIN 2 TIMES DAILY IN THE MORNING AND EVENING  3  . diltiazem (CARDIZEM CD) 180 MG 24 hr capsule Take 180 mg by mouth daily.  0  . diltiazem (TIAZAC) 180 MG 24 hr capsule     . erythromycin ophthalmic ointment APPLY A SMALL AMOUNT INTO LEFT EYE THREE TIMES A DAY    . furosemide (LASIX) 40 MG tablet Take 40 mg by mouth 2 (two) times daily.    Marland Kitchen glucose blood test strip OneTouch Ultra Test strips  Take 1 strip(s) 4 times a day by miscell. route for 30 days.    . hydrALAZINE (APRESOLINE) 50 MG tablet Take 50 mg by mouth 2 (two) times daily.   0  . insulin aspart (NOVOLOG FLEXPEN) 100 UNIT/ML FlexPen Inject 2-12 Units into the skin 4 (four) times daily -  with meals and at bedtime. Per sliding scale    . Insulin  Glargine (BASAGLAR KWIKPEN) 100 UNIT/ML SOPN Basaglar KwikPen U-100 Insulin 100 unit/mL (3 mL) subcutaneous  Inject 24 unit(s) every day by subcutaneous route in the morning for 30 days.    . Insulin Pen Needle 29G X 12.7MM MISC BD Ultra-Fine Original Pen Needle 29 gauge x 1/2"    . iron polysaccharides (NIFEREX) 150 MG capsule Take 150 mg by mouth at bedtime.    . Lancets (ONETOUCH ULTRASOFT) lancets **SIGN AOB ** USE TO TEST BLOOD SUGAR BEFORE MEALS AT BEDTIME  0  . ONE TOUCH ULTRA TEST test strip TAKE 1 STRIP(S) 4 TIMES A DAY DX CODE E11.65  3  . pantoprazole (PROTONIX) 40 MG tablet Take 40 mg by mouth daily.  0  . warfarin (COUMADIN) 5 MG tablet warfarin 5 mg tablet  USE AS DIRECTED 5MG + 2MG = 7MG ON M/W/F ALTERNATE WITH 5MG + 3MG = 8MG ON TUES/ THURS/SAT/SUN     No current facility-administered medications on file prior to visit.     Allergies  Allergen Reactions  . Morphine   . Morphine And Related Other (See Comments)    confusion  . Oxycodone Other (See Comments)    confusion  . Penicillins     Has patient had a PCN reaction causing  immediate rash, facial/tongue/throat swelling, SOB or lightheadedness with hypotension: Unknown Has patient had a PCN reaction causing severe rash involving mucus membranes or skin necrosis: Unknown Has patient had a PCN reaction that required hospitalization: Unknown Has patient had a PCN reaction occurring within the last 10 years: Unknown If all of the above answers are "NO", then may proceed with Cephalosporin use.    Objective:  Vascular Examination: Capillary refill time immediate x 10 digits.  Dorsalis pedis pulses 1/4 b/l.  Posterior tibial pulses 1/4 b/l.  No digital hair x 10 digits.  Skin temperature WNL b/l.  Dermatological Examination: Skin with normal turgor, texture and tone b/l.  Toenails 1-5 b/l discolored, thick, dystrophic with subungual debris and pain with palpation to nailbeds due to thickness of  nails.  Hyperkeratotic lesion b/l hallux. No erythema, no edema, no drainage, no flocculence noted.   Musculoskeletal: Muscle strength 5/5 b/l to all LE muscle groups.  Neurological: Sensation diminished RLE, and intact 5/5 LLE, when with 10 gram monofilament.  Assessment: 1. Painful onychomycosis toenails 1-5 b/l 2. Calluses b/l hallux 3. NIDDM with neuropathy   Plan: 1. Toenails 1-5 b/l were debrided in length and girth without iatrogenic bleeding. 2. Calluses pared b/l hallux utilizing sterile scalpel blade without incident.  3. Patient to continue soft, supportive shoe gear daily. 4. Patient to report any pedal injuries to medical professional immediately. 5. Follow up 3 months.  6. Patient/POA to call should there be a concern in the interim.

## 2019-02-12 ENCOUNTER — Other Ambulatory Visit: Payer: Self-pay | Admitting: Internal Medicine

## 2019-02-12 DIAGNOSIS — E2839 Other primary ovarian failure: Secondary | ICD-10-CM

## 2019-02-12 DIAGNOSIS — Z1231 Encounter for screening mammogram for malignant neoplasm of breast: Secondary | ICD-10-CM

## 2019-02-15 ENCOUNTER — Other Ambulatory Visit: Payer: Medicare Other

## 2019-02-15 ENCOUNTER — Ambulatory Visit: Payer: Medicare Other

## 2019-02-18 DIAGNOSIS — Z7901 Long term (current) use of anticoagulants: Secondary | ICD-10-CM | POA: Diagnosis not present

## 2019-03-01 DIAGNOSIS — Z7901 Long term (current) use of anticoagulants: Secondary | ICD-10-CM | POA: Diagnosis not present

## 2019-03-01 DIAGNOSIS — I480 Paroxysmal atrial fibrillation: Secondary | ICD-10-CM | POA: Diagnosis not present

## 2019-03-04 DIAGNOSIS — R4189 Other symptoms and signs involving cognitive functions and awareness: Secondary | ICD-10-CM | POA: Diagnosis not present

## 2019-03-04 DIAGNOSIS — E785 Hyperlipidemia, unspecified: Secondary | ICD-10-CM | POA: Diagnosis not present

## 2019-03-04 DIAGNOSIS — K219 Gastro-esophageal reflux disease without esophagitis: Secondary | ICD-10-CM | POA: Diagnosis not present

## 2019-03-04 DIAGNOSIS — E1165 Type 2 diabetes mellitus with hyperglycemia: Secondary | ICD-10-CM | POA: Diagnosis not present

## 2019-03-04 DIAGNOSIS — D509 Iron deficiency anemia, unspecified: Secondary | ICD-10-CM | POA: Diagnosis not present

## 2019-03-04 DIAGNOSIS — I10 Essential (primary) hypertension: Secondary | ICD-10-CM | POA: Diagnosis not present

## 2019-03-04 DIAGNOSIS — I639 Cerebral infarction, unspecified: Secondary | ICD-10-CM | POA: Diagnosis not present

## 2019-03-04 DIAGNOSIS — I4891 Unspecified atrial fibrillation: Secondary | ICD-10-CM | POA: Diagnosis not present

## 2019-03-04 DIAGNOSIS — I739 Peripheral vascular disease, unspecified: Secondary | ICD-10-CM | POA: Diagnosis not present

## 2019-03-04 DIAGNOSIS — Z1211 Encounter for screening for malignant neoplasm of colon: Secondary | ICD-10-CM | POA: Diagnosis not present

## 2019-04-30 ENCOUNTER — Ambulatory Visit
Admission: RE | Admit: 2019-04-30 | Discharge: 2019-04-30 | Disposition: A | Discharge status: Home / Self Care | Payer: Medicare Other | Source: Ambulatory Visit | Attending: Internal Medicine | Admitting: Internal Medicine

## 2019-04-30 ENCOUNTER — Other Ambulatory Visit: Payer: Self-pay

## 2019-04-30 DIAGNOSIS — Z1231 Encounter for screening mammogram for malignant neoplasm of breast: Secondary | ICD-10-CM

## 2019-04-30 DIAGNOSIS — E2839 Other primary ovarian failure: Secondary | ICD-10-CM

## 2019-05-14 ENCOUNTER — Ambulatory Visit: Payer: Medicare Other | Admitting: Podiatry

## 2019-05-17 ENCOUNTER — Encounter (INDEPENDENT_AMBULATORY_CARE_PROVIDER_SITE_OTHER): Payer: Medicare HMO | Admitting: Ophthalmology

## 2019-05-17 ENCOUNTER — Other Ambulatory Visit: Payer: Self-pay

## 2019-05-17 DIAGNOSIS — E113512 Type 2 diabetes mellitus with proliferative diabetic retinopathy with macular edema, left eye: Secondary | ICD-10-CM | POA: Diagnosis not present

## 2019-05-17 DIAGNOSIS — I10 Essential (primary) hypertension: Secondary | ICD-10-CM

## 2019-05-17 DIAGNOSIS — E11311 Type 2 diabetes mellitus with unspecified diabetic retinopathy with macular edema: Secondary | ICD-10-CM

## 2019-05-17 DIAGNOSIS — E113591 Type 2 diabetes mellitus with proliferative diabetic retinopathy without macular edema, right eye: Secondary | ICD-10-CM | POA: Diagnosis not present

## 2019-05-17 DIAGNOSIS — H35033 Hypertensive retinopathy, bilateral: Secondary | ICD-10-CM

## 2019-05-17 DIAGNOSIS — H43813 Vitreous degeneration, bilateral: Secondary | ICD-10-CM

## 2019-05-20 ENCOUNTER — Encounter: Payer: Self-pay | Admitting: Podiatry

## 2019-05-20 ENCOUNTER — Ambulatory Visit (INDEPENDENT_AMBULATORY_CARE_PROVIDER_SITE_OTHER): Payer: Medicare HMO | Admitting: Podiatry

## 2019-05-20 ENCOUNTER — Other Ambulatory Visit: Payer: Self-pay

## 2019-05-20 DIAGNOSIS — E1142 Type 2 diabetes mellitus with diabetic polyneuropathy: Secondary | ICD-10-CM

## 2019-05-20 DIAGNOSIS — B351 Tinea unguium: Secondary | ICD-10-CM

## 2019-05-20 DIAGNOSIS — M79676 Pain in unspecified toe(s): Secondary | ICD-10-CM | POA: Diagnosis not present

## 2019-05-20 DIAGNOSIS — L84 Corns and callosities: Secondary | ICD-10-CM | POA: Diagnosis not present

## 2019-05-20 NOTE — Progress Notes (Signed)
Subjective: JAMAIRA SHERK presents today for follow up of preventative diabetic foot care and callus(es) b/l feet and painful mycotic toenails b/l that are difficult to trim. Pain interferes with ambulation. Aggravating factors include wearing enclosed shoe gear. Pain is relieved with periodic professional debridement..   Allergies  Allergen Reactions  . Morphine   . Morphine And Related Other (See Comments)    confusion  . Oxycodone Other (See Comments)    confusion  . Penicillins     Has patient had a PCN reaction causing immediate rash, facial/tongue/throat swelling, SOB or lightheadedness with hypotension: Unknown Has patient had a PCN reaction causing severe rash involving mucus membranes or skin necrosis: Unknown Has patient had a PCN reaction that required hospitalization: Unknown Has patient had a PCN reaction occurring within the last 10 years: Unknown If all of the above answers are "NO", then may proceed with Cephalosporin use.     Objective: There were no vitals filed for this visit.  Vascular Examination:  Capillary refill time to digits immediate b/l, faintly palpable pedal pulses b/l, pedal hair absent b/l and skin temperature gradient within normal limits b/l  Dermatological Examination: Pedal skin with normal turgor, texture and tone bilaterally, no open wounds bilaterally, no interdigital macerations bilaterally, toenails 1-5 b/l elongated, dystrophic, thickened, crumbly with subungual debris and hyperkeratotic lesion(s) b/l hallux and b/l heels.  No erythema, no edema, no drainage, no flocculence  Musculoskeletal: Normal muscle strength 5/5 to all lower extremity muscle groups bilaterally, no gross bony deformities bilaterally and no pain crepitus or joint limitation noted with ROM b/l  Neurological: Protective sensation intact 5/5 intact bilaterally with 10g monofilament b/l and Protective sensation diminished RLE and intact LLE when tested with 10 gram  monofilament.  Assessment: Pain due to onychomycosis of toenail  Calluses b/l hallux and b/l heels  Diabetic peripheral neuropathy associated with type 2 diabetes mellitus ([place])  Plan: -Continue diabetic foot care principles. Literature dispensed on today.  -Toenails 1-5 b/l were debrided in length and girth without iatrogenic bleeding. -calluses were debrided without complication or incident. Total number debrided =4, b/l hallux and b/l heels -Patient to continue soft, supportive shoe gear daily. -Patient to report any pedal injuries to medical professional immediately. -Patient/POA to call should there be question/concern in the interim.  Return in about 3 months (around 08/17/2019) for diabetic nail and callus trim/ Coumadin.

## 2019-05-20 NOTE — Patient Instructions (Signed)
Diabetes Mellitus and Foot Care Foot care is an important part of your health, especially when you have diabetes. Diabetes may cause you to have problems because of poor blood flow (circulation) to your feet and legs, which can cause your skin to:  Become thinner and drier.  Break more easily.  Heal more slowly.  Peel and crack. You may also have nerve damage (neuropathy) in your legs and feet, causing decreased feeling in them. This means that you may not notice minor injuries to your feet that could lead to more serious problems. Noticing and addressing any potential problems early is the best way to prevent future foot problems. How to care for your feet Foot hygiene  Wash your feet daily with warm water and mild soap. Do not use hot water. Then, pat your feet and the areas between your toes until they are completely dry. Do not soak your feet as this can dry your skin.  Trim your toenails straight across. Do not dig under them or around the cuticle. File the edges of your nails with an emery board or nail file.  Apply a moisturizing lotion or petroleum jelly to the skin on your feet and to dry, brittle toenails. Use lotion that does not contain alcohol and is unscented. Do not apply lotion between your toes. Shoes and socks  Wear clean socks or stockings every day. Make sure they are not too tight. Do not wear knee-high stockings since they may decrease blood flow to your legs.  Wear shoes that fit properly and have enough cushioning. Always look in your shoes before you put them on to be sure there are no objects inside.  To break in new shoes, wear them for just a few hours a day. This prevents injuries on your feet. Wounds, scrapes, corns, and calluses  Check your feet daily for blisters, cuts, bruises, sores, and redness. If you cannot see the bottom of your feet, use a mirror or ask someone for help.  Do not cut corns or calluses or try to remove them with medicine.  If you  find a minor scrape, cut, or break in the skin on your feet, keep it and the skin around it clean and dry. You may clean these areas with mild soap and water. Do not clean the area with peroxide, alcohol, or iodine.  If you have a wound, scrape, corn, or callus on your foot, look at it several times a day to make sure it is healing and not infected. Check for: ? Redness, swelling, or pain. ? Fluid or blood. ? Warmth. ? Pus or a bad smell. General instructions  Do not cross your legs. This may decrease blood flow to your feet.  Do not use heating pads or hot water bottles on your feet. They may burn your skin. If you have lost feeling in your feet or legs, you may not know this is happening until it is too late.  Protect your feet from hot and cold by wearing shoes, such as at the beach or on hot pavement.  Schedule a complete foot exam at least once a year (annually) or more often if you have foot problems. If you have foot problems, report any cuts, sores, or bruises to your health care provider immediately. Contact a health care provider if:  You have a medical condition that increases your risk of infection and you have any cuts, sores, or bruises on your feet.  You have an injury that is not   healing.  You have redness on your legs or feet.  You feel burning or tingling in your legs or feet.  You have pain or cramps in your legs and feet.  Your legs or feet are numb.  Your feet always feel cold.  You have pain around a toenail. Get help right away if:  You have a wound, scrape, corn, or callus on your foot and: ? You have pain, swelling, or redness that gets worse. ? You have fluid or blood coming from the wound, scrape, corn, or callus. ? Your wound, scrape, corn, or callus feels warm to the touch. ? You have pus or a bad smell coming from the wound, scrape, corn, or callus. ? You have a fever. ? You have a red line going up your leg. Summary  Check your feet every day  for cuts, sores, red spots, swelling, and blisters.  Moisturize feet and legs daily.  Wear shoes that fit properly and have enough cushioning.  If you have foot problems, report any cuts, sores, or bruises to your health care provider immediately.  Schedule a complete foot exam at least once a year (annually) or more often if you have foot problems. This information is not intended to replace advice given to you by your health care provider. Make sure you discuss any questions you have with your health care provider. Document Revised: 12/19/2018 Document Reviewed: 04/29/2016 Elsevier Patient Education  2020 [employer]  

## 2019-08-02 ENCOUNTER — Ambulatory Visit: Payer: Medicare HMO | Admitting: Internal Medicine

## 2019-08-19 ENCOUNTER — Other Ambulatory Visit: Payer: Self-pay

## 2019-08-19 ENCOUNTER — Encounter: Payer: Self-pay | Admitting: Podiatry

## 2019-08-19 ENCOUNTER — Ambulatory Visit: Payer: Medicare HMO | Admitting: Podiatry

## 2019-08-19 VITALS — Temp 96.3°F

## 2019-08-19 DIAGNOSIS — D229 Melanocytic nevi, unspecified: Secondary | ICD-10-CM

## 2019-08-19 DIAGNOSIS — B351 Tinea unguium: Secondary | ICD-10-CM | POA: Diagnosis not present

## 2019-08-19 DIAGNOSIS — L84 Corns and callosities: Secondary | ICD-10-CM | POA: Diagnosis not present

## 2019-08-19 DIAGNOSIS — M79676 Pain in unspecified toe(s): Secondary | ICD-10-CM | POA: Diagnosis not present

## 2019-08-19 DIAGNOSIS — E1142 Type 2 diabetes mellitus with diabetic polyneuropathy: Secondary | ICD-10-CM

## 2019-08-19 NOTE — Patient Instructions (Addendum)
Recommend dermatology consult for mole on right foot.  Diabetes Mellitus and Foot Care Foot care is an important part of your health, especially when you have diabetes. Diabetes may cause you to have problems because of poor blood flow (circulation) to your feet and legs, which can cause your skin to:  Become thinner and drier.  Break more easily.  Heal more slowly.  Peel and crack. You may also have nerve damage (neuropathy) in your legs and feet, causing decreased feeling in them. This means that you may not notice minor injuries to your feet that could lead to more serious problems. Noticing and addressing any potential problems early is the best way to prevent future foot problems. How to care for your feet Foot hygiene  Wash your feet daily with warm water and mild soap. Do not use hot water. Then, pat your feet and the areas between your toes until they are completely dry. Do not soak your feet as this can dry your skin.  Trim your toenails straight across. Do not dig under them or around the cuticle. File the edges of your nails with an emery board or nail file.  Apply a moisturizing lotion or petroleum jelly to the skin on your feet and to dry, brittle toenails. Use lotion that does not contain alcohol and is unscented. Do not apply lotion between your toes. Shoes and socks  Wear clean socks or stockings every day. Make sure they are not too tight. Do not wear knee-high stockings since they may decrease blood flow to your legs.  Wear shoes that fit properly and have enough cushioning. Always look in your shoes before you put them on to be sure there are no objects inside.  To break in new shoes, wear them for just a few hours a day. This prevents injuries on your feet. Wounds, scrapes, corns, and calluses  Check your feet daily for blisters, cuts, bruises, sores, and redness. If you cannot see the bottom of your feet, use a mirror or ask someone for help.  Do not cut corns or  calluses or try to remove them with medicine.  If you find a minor scrape, cut, or break in the skin on your feet, keep it and the skin around it clean and dry. You may clean these areas with mild soap and water. Do not clean the area with peroxide, alcohol, or iodine.  If you have a wound, scrape, corn, or callus on your foot, look at it several times a day to make sure it is healing and not infected. Check for: ? Redness, swelling, or pain. ? Fluid or blood. ? Warmth. ? Pus or a bad smell. General instructions  Do not cross your legs. This may decrease blood flow to your feet.  Do not use heating pads or hot water bottles on your feet. They may burn your skin. If you have lost feeling in your feet or legs, you may not know this is happening until it is too late.  Protect your feet from hot and cold by wearing shoes, such as at the beach or on hot pavement.  Schedule a complete foot exam at least once a year (annually) or more often if you have foot problems. If you have foot problems, report any cuts, sores, or bruises to your health care provider immediately. Contact a health care provider if:  You have a medical condition that increases your risk of infection and you have any cuts, sores, or bruises on your  feet.  You have an injury that is not healing.  You have redness on your legs or feet.  You feel burning or tingling in your legs or feet.  You have pain or cramps in your legs and feet.  Your legs or feet are numb.  Your feet always feel cold.  You have pain around a toenail. Get help right away if:  You have a wound, scrape, corn, or callus on your foot and: ? You have pain, swelling, or redness that gets worse. ? You have fluid or blood coming from the wound, scrape, corn, or callus. ? Your wound, scrape, corn, or callus feels warm to the touch. ? You have pus or a bad smell coming from the wound, scrape, corn, or callus. ? You have a fever. ? You have a red line  going up your leg. Summary  Check your feet every day for cuts, sores, red spots, swelling, and blisters.  Moisturize feet and legs daily.  Wear shoes that fit properly and have enough cushioning.  If you have foot problems, report any cuts, sores, or bruises to your health care provider immediately.  Schedule a complete foot exam at least once a year (annually) or more often if you have foot problems. This information is not intended to replace advice given to you by your health care provider. Make sure you discuss any questions you have with your health care provider. Document Revised: 12/19/2018 Document Reviewed: 04/29/2016 Elsevier Patient Education  [place].

## 2019-08-26 NOTE — Progress Notes (Signed)
Subjective: Tracy Elliott presents today preventative diabetic foot care and callus(es) b/l feet and painful mycotic toenails b/l that are difficult to trim. Pain interferes with ambulation. Aggravating factors include wearing enclosed shoe gear. Pain is relieved with periodic professional debridement.  Audley Hose, MD is patient's PCP. Last visit was: 03/04/2019.  Past Medical History:  Diagnosis Date  . Anemia   . Asthma    in the past  . Atrial fibrillation ([place])   . Chronic kidney disease   . Diabetes mellitus without complication ([place])    type 2  . Dysphagia   . GERD (gastroesophageal reflux disease)   . Hypertension   . Stroke [facility]) 12/2015   some right sided weakness, decreased vision on right     Current Outpatient Medications on File Prior to Visit  Medication Sig Dispense Refill  . aspirin 81 MG EC tablet Take 81 mg by mouth daily.  0  . ASPIRIN 81 PO aspirin 81 mg tablet,delayed release  TAKE 1 TABLET BY MOUTH EVERY DAY    . atorvastatin (LIPITOR) 80 MG tablet atorvastatin 80 mg tablet  Take 1 tablet(s) every day by oral route at bedtime for 90 days.    . BD ULTRA-FINE PEN NEEDLES 29G X 12.7MM MISC USE BEFORE MEALS AND AT BEDTIME  0  . Blood Glucose Monitoring Suppl (ONE TOUCH ULTRA 2) w/Device KIT **SIGN AOB USE TO TEST BLOOD SUGAR FOUR TIMES DAILY  0  . carvedilol (COREG) 25 MG tablet Take 12.5 mg by mouth 2 (two) times daily.   0  . clotrimazole (LOTRIMIN) 1 % cream APPLY TO AFFECTED & SURROUNDING AREAS OF SKIN 2 TIMES DAILY IN THE MORNING AND EVENING  3  . diltiazem (CARDIZEM CD) 180 MG 24 hr capsule Take 180 mg by mouth daily.  0  . diltiazem (TIAZAC) 180 MG 24 hr capsule     . erythromycin ophthalmic ointment APPLY A SMALL AMOUNT INTO LEFT EYE THREE TIMES A DAY    . furosemide (LASIX) 40 MG tablet Take 40 mg by mouth 2 (two) times daily.    Marland Kitchen glucose blood test strip OneTouch Ultra Test strips  Take 1 strip(s) 4 times a day by miscell. route for 30 days.     . hydrALAZINE (APRESOLINE) 50 MG tablet Take 50 mg by mouth 2 (two) times daily.   0  . insulin aspart (NOVOLOG FLEXPEN) 100 UNIT/ML FlexPen Inject 2-12 Units into the skin 4 (four) times daily -  with meals and at bedtime. Per sliding scale    . Insulin Glargine (BASAGLAR KWIKPEN) 100 UNIT/ML SOPN Basaglar KwikPen U-100 Insulin 100 unit/mL (3 mL) subcutaneous  Inject 24 unit(s) every day by subcutaneous route in the morning for 30 days.    . Insulin Pen Needle 29G X 12.7MM MISC BD Ultra-Fine Original Pen Needle 29 gauge x 1/2"    . iron polysaccharides (NIFEREX) 150 MG capsule Take 150 mg by mouth at bedtime.    . Lancets (ONETOUCH ULTRASOFT) lancets **SIGN AOB ** USE TO TEST BLOOD SUGAR BEFORE MEALS AT BEDTIME  0  . ONE TOUCH ULTRA TEST test strip TAKE 1 STRIP(S) 4 TIMES A DAY DX CODE E11.65  3  . pantoprazole (PROTONIX) 40 MG tablet Take 40 mg by mouth daily.  0  . warfarin (COUMADIN) 5 MG tablet warfarin 5 mg tablet  USE AS DIRECTED 5MG + 2MG = 7MG ON M/W/F ALTERNATE WITH 5MG + 3MG = 8MG ON TUES/ THURS/SAT/SUN     No  current facility-administered medications on file prior to visit.     Allergies  Allergen Reactions  . Morphine   . Morphine And Related Other (See Comments)    confusion  . Oxycodone Other (See Comments)    confusion  . Penicillins     Has patient had a PCN reaction causing immediate rash, facial/tongue/throat swelling, SOB or lightheadedness with hypotension: Unknown Has patient had a PCN reaction causing severe rash involving mucus membranes or skin necrosis: Unknown Has patient had a PCN reaction that required hospitalization: Unknown Has patient had a PCN reaction occurring within the last 10 years: Unknown If all of the above answers are "NO", then may proceed with Cephalosporin use.    Objective: Tracy Elliott is a pleasant 74 y.o. y.o. Patient Race: Black or African American [2]  female in NAD. AAO x 3.  Vitals:   08/19/19 1635  Temp: (!) 96.3 F  (35.7 C)    Vascular Examination: Capillary refill time to digits immediate b/l. Faintly palpable pedal pulses b/l. Pedal hair absent b/l Skin temperature gradient within normal limits b/l.  Dermatological Examination: Pedal skin with normal turgor, texture and tone bilaterally. No open wounds bilaterally. No interdigital macerations bilaterally. Toenails 1-5 b/l elongated, dystrophic, thickened, crumbly with subungual debris and tenderness to dorsal palpation. Nevus noted plantar aspect right foot measuring 0.7 x 0.5 cm with asymmetry, irregular borders, black/brown in color, no depth, no elevation, no bleeding. Hyperkeratotic lesion(s) L hallux, R hallux and plantar aspect b/l heel pads.  No erythema, no edema, no drainage, no flocculence.  Musculoskeletal: Normal muscle strength 5/5 to all lower extremity muscle groups bilaterally. No gross bony deformities bilaterally. No pain crepitus or joint limitation noted with ROM b/l.  Neurological Examination: Protective sensation diminished with 10g monofilament b/l.  Assessment: 1. Pain due to onychomycosis of toenail   2. Callus   3. Atypical mole   4. Diabetic peripheral neuropathy associated with type 2 diabetes mellitus ([place])    Plan: -Examined patient. -Continue diabetic foot care principles. Literature dispensed on today.  -Toenails 1-5 b/l were debrided in length and girth with sterile nail nippers and dremel without iatrogenic bleeding.  -For atypical mole right foot, recommend Dermatology consult.  -Callus(es) L hallux, R hallux and plantar aspect b/l heel pads pared utilizing sterile scalpel blade without complication or incident. Total number debrided =4. -Patient to continue soft, supportive shoe gear daily. -Patient to report any pedal injuries to medical professional immediately. -Patient/POA to call should there be question/concern in the interim.  Return in about 3 months (around 11/19/2019).  Marzetta Board, DPM

## 2019-11-18 ENCOUNTER — Encounter: Payer: Self-pay | Admitting: Podiatry

## 2019-11-18 ENCOUNTER — Ambulatory Visit: Payer: Medicare HMO | Admitting: Podiatry

## 2019-11-18 ENCOUNTER — Other Ambulatory Visit: Payer: Self-pay

## 2019-11-18 ENCOUNTER — Encounter (INDEPENDENT_AMBULATORY_CARE_PROVIDER_SITE_OTHER): Payer: Medicare HMO | Admitting: Ophthalmology

## 2019-11-18 DIAGNOSIS — E1142 Type 2 diabetes mellitus with diabetic polyneuropathy: Secondary | ICD-10-CM | POA: Diagnosis not present

## 2019-11-18 DIAGNOSIS — B351 Tinea unguium: Secondary | ICD-10-CM

## 2019-11-18 DIAGNOSIS — L84 Corns and callosities: Secondary | ICD-10-CM | POA: Diagnosis not present

## 2019-11-18 DIAGNOSIS — M79676 Pain in unspecified toe(s): Secondary | ICD-10-CM | POA: Diagnosis not present

## 2019-11-18 DIAGNOSIS — L600 Ingrowing nail: Secondary | ICD-10-CM

## 2019-11-18 NOTE — Patient Instructions (Addendum)
Apply Neosporin to both great toes once daily for one week.     Diabetes Mellitus and Foot Care Foot care is an important part of your health, especially when you have diabetes. Diabetes may cause you to have problems because of poor blood flow (circulation) to your feet and legs, which can cause your skin to:  Become thinner and drier.  Break more easily.  Heal more slowly.  Peel and crack. You may also have nerve damage (neuropathy) in your legs and feet, causing decreased feeling in them. This means that you may not notice minor injuries to your feet that could lead to more serious problems. Noticing and addressing any potential problems early is the best way to prevent future foot problems. How to care for your feet Foot hygiene  Wash your feet daily with warm water and mild soap. Do not use hot water. Then, pat your feet and the areas between your toes until they are completely dry. Do not soak your feet as this can dry your skin.  Trim your toenails straight across. Do not dig under them or around the cuticle. File the edges of your nails with an emery board or nail file.  Apply a moisturizing lotion or petroleum jelly to the skin on your feet and to dry, brittle toenails. Use lotion that does not contain alcohol and is unscented. Do not apply lotion between your toes. Shoes and socks  Wear clean socks or stockings every day. Make sure they are not too tight. Do not wear knee-high stockings since they may decrease blood flow to your legs.  Wear shoes that fit properly and have enough cushioning. Always look in your shoes before you put them on to be sure there are no objects inside.  To break in new shoes, wear them for just a few hours a day. This prevents injuries on your feet. Wounds, scrapes, corns, and calluses  Check your feet daily for blisters, cuts, bruises, sores, and redness. If you cannot see the bottom of your feet, use a mirror or ask someone for help.  Do not  cut corns or calluses or try to remove them with medicine.  If you find a minor scrape, cut, or break in the skin on your feet, keep it and the skin around it clean and dry. You may clean these areas with mild soap and water. Do not clean the area with peroxide, alcohol, or iodine.  If you have a wound, scrape, corn, or callus on your foot, look at it several times a day to make sure it is healing and not infected. Check for: ? Redness, swelling, or pain. ? Fluid or blood. ? Warmth. ? Pus or a bad smell. General instructions  Do not cross your legs. This may decrease blood flow to your feet.  Do not use heating pads or hot water bottles on your feet. They may burn your skin. If you have lost feeling in your feet or legs, you may not know this is happening until it is too late.  Protect your feet from hot and cold by wearing shoes, such as at the beach or on hot pavement.  Schedule a complete foot exam at least once a year (annually) or more often if you have foot problems. If you have foot problems, report any cuts, sores, or bruises to your health care provider immediately. Contact a health care provider if:  You have a medical condition that increases your risk of infection and you have any  cuts, sores, or bruises on your feet.  You have an injury that is not healing.  You have redness on your legs or feet.  You feel burning or tingling in your legs or feet.  You have pain or cramps in your legs and feet.  Your legs or feet are numb.  Your feet always feel cold.  You have pain around a toenail. Get help right away if:  You have a wound, scrape, corn, or callus on your foot and: ? You have pain, swelling, or redness that gets worse. ? You have fluid or blood coming from the wound, scrape, corn, or callus. ? Your wound, scrape, corn, or callus feels warm to the touch. ? You have pus or a bad smell coming from the wound, scrape, corn, or callus. ? You have a fever. ? You  have a red line going up your leg. Summary  Check your feet every day for cuts, sores, red spots, swelling, and blisters.  Moisturize feet and legs daily.  Wear shoes that fit properly and have enough cushioning.  If you have foot problems, report any cuts, sores, or bruises to your health care provider immediately.  Schedule a complete foot exam at least once a year (annually) or more often if you have foot problems. This information is not intended to replace advice given to you by your health care provider. Make sure you discuss any questions you have with your health care provider. Document Revised: 12/19/2018 Document Reviewed: 04/29/2016 Elsevier Patient Education  [place].

## 2019-11-19 NOTE — Progress Notes (Signed)
Subjective: Tracy Elliott presents today preventative diabetic foot care and callus(es) b/l feet and painful mycotic toenails b/l that are difficult to trim. Pain interferes with ambulation. Aggravating factors include wearing enclosed shoe gear. Pain is relieved with periodic professional debridement.  Audley Hose, MD is patient's PCP. Last visit was: 03/04/2019.  Past Medical History:  Diagnosis Date  . Anemia   . Asthma    in the past  . Atrial fibrillation ([place])   . Chronic kidney disease   . Diabetes mellitus without complication ([place])    type 2  . Dysphagia   . GERD (gastroesophageal reflux disease)   . Hypertension   . Stroke [facility]) 12/2015   some right sided weakness, decreased vision on right     Current Outpatient Medications on File Prior to Visit  Medication Sig Dispense Refill  . aspirin 81 MG EC tablet Take 81 mg by mouth daily.  0  . ASPIRIN 81 PO aspirin 81 mg tablet,delayed release  TAKE 1 TABLET BY MOUTH EVERY DAY    . atorvastatin (LIPITOR) 80 MG tablet atorvastatin 80 mg tablet  Take 1 tablet(s) every day by oral route at bedtime for 90 days.    . BD ULTRA-FINE PEN NEEDLES 29G X 12.7MM MISC USE BEFORE MEALS AND AT BEDTIME  0  . Blood Glucose Monitoring Suppl (ONE TOUCH ULTRA 2) w/Device KIT **SIGN AOB USE TO TEST BLOOD SUGAR FOUR TIMES DAILY  0  . carvedilol (COREG) 25 MG tablet Take 12.5 mg by mouth 2 (two) times daily.   0  . cinacalcet (SENSIPAR) 30 MG tablet     . clotrimazole (LOTRIMIN) 1 % cream APPLY TO AFFECTED & SURROUNDING AREAS OF SKIN 2 TIMES DAILY IN THE MORNING AND EVENING  3  . diltiazem (CARDIZEM CD) 180 MG 24 hr capsule Take 180 mg by mouth daily.  0  . diltiazem (TIAZAC) 180 MG 24 hr capsule     . erythromycin ophthalmic ointment APPLY A SMALL AMOUNT INTO LEFT EYE THREE TIMES A DAY    . furosemide (LASIX) 40 MG tablet Take 40 mg by mouth 2 (two) times daily.    Marland Kitchen glucose blood test strip OneTouch Ultra Test strips  Take 1 strip(s) 4  times a day by miscell. route for 30 days.    . hydrALAZINE (APRESOLINE) 50 MG tablet Take 50 mg by mouth 2 (two) times daily.   0  . insulin aspart (NOVOLOG FLEXPEN) 100 UNIT/ML FlexPen Inject 2-12 Units into the skin 4 (four) times daily -  with meals and at bedtime. Per sliding scale    . Insulin Glargine (BASAGLAR KWIKPEN) 100 UNIT/ML SOPN Basaglar KwikPen U-100 Insulin 100 unit/mL (3 mL) subcutaneous  Inject 24 unit(s) every day by subcutaneous route in the morning for 30 days.    . Insulin Pen Needle 29G X 12.7MM MISC BD Ultra-Fine Original Pen Needle 29 gauge x 1/2"    . iron polysaccharides (NIFEREX) 150 MG capsule Take 150 mg by mouth at bedtime.    . Lancets (ONETOUCH ULTRASOFT) lancets **SIGN AOB ** USE TO TEST BLOOD SUGAR BEFORE MEALS AT BEDTIME  0  . ONE TOUCH ULTRA TEST test strip TAKE 1 STRIP(S) 4 TIMES A DAY DX CODE E11.65  3  . pantoprazole (PROTONIX) 40 MG tablet Take 40 mg by mouth daily.  0  . warfarin (COUMADIN) 5 MG tablet warfarin 5 mg tablet  USE AS DIRECTED 5MG + 2MG = 7MG ON M/W/F ALTERNATE WITH 5MG + 3MG =  8MG ON TUES/ THURS/SAT/SUN     No current facility-administered medications on file prior to visit.     Allergies  Allergen Reactions  . Morphine   . Morphine And Related Other (See Comments)    confusion  . Oxycodone Other (See Comments)    confusion  . Penicillins     Has patient had a PCN reaction causing immediate rash, facial/tongue/throat swelling, SOB or lightheadedness with hypotension: Unknown Has patient had a PCN reaction causing severe rash involving mucus membranes or skin necrosis: Unknown Has patient had a PCN reaction that required hospitalization: Unknown Has patient had a PCN reaction occurring within the last 10 years: Unknown If all of the above answers are "NO", then may proceed with Cephalosporin use.    Objective: Tracy Elliott is a pleasant 74 y.o. African American female in NAD. AAO x 3.   There were no vitals filed for this  visit.  Vascular Examination: Capillary refill time to digits immediate b/l. Faintly palpable pedal pulses b/l. Pedal hair absent b/l Skin temperature gradient within normal limits b/l.  Dermatological Examination: Pedal skin with normal turgor, texture and tone bilaterally. No open wounds bilaterally. No interdigital macerations bilaterally. Toenails 1-5 b/l elongated, discolored, dystrophic, thickened, crumbly with subungual debris and tenderness to dorsal palpation. Incurvated nailplate b/l border(s) L hallux and R hallux.  Nail border hypertrophy present. There is tenderness to palpation. Sign(s) of infection: no clinical signs of infection noted on examination today.. Hyperkeratotic lesion(s) L hallux, R hallux and plantar aspect b/l heel pads.  No erythema, no edema, no drainage, no flocculence. Nevus noted plantar aspect right foot measuring 0.7 x 0.5 cm with asymmetry, irregular borders, black/brown in color, no depth, no elevation, no bleeding  Musculoskeletal: Normal muscle strength 5/5 to all lower extremity muscle groups bilaterally. No gross bony deformities bilaterally. No pain crepitus or joint limitation noted with ROM b/l.  Neurological Examination: Protective sensation diminished with 10g monofilament b/l.  Assessment: 1. Pain due to onychomycosis of toenail   2. Callus   3. Ingrown toenail without infection   4. Diabetic peripheral neuropathy associated with type 2 diabetes mellitus ([place])     Plan: -Examined patient. -Continue diabetic foot care principles. Literature dispensed on today.  -Toenails 1-5 b/l were debrided in length and girth with sterile nail nippers and dremel without iatrogenic bleeding. Offending nail borders debrided and curretaged b/l great toes. Borders cleansed with alcohol. Antibiotic ointment applied. Patient was instructed to apply Neosporin to both great toes once daily for one week. -Callus(es) L hallux, R hallux and plantar aspect b/l heel pads  pared utilizing sterile scalpel blade without complication or incident. Total number debrided =4. -Patient to continue soft, supportive shoe gear daily. -Patient to report any pedal injuries to medical professional immediately. -Patient/POA to call should there be question/concern in the interim.  Return in about 3 months (around 02/18/2020) for diabetic nail and callus trim.  Marzetta Board, DPM

## 2019-11-25 DIAGNOSIS — M810 Age-related osteoporosis without current pathological fracture: Secondary | ICD-10-CM | POA: Insufficient documentation

## 2019-11-25 DIAGNOSIS — K5909 Other constipation: Secondary | ICD-10-CM | POA: Insufficient documentation

## 2019-11-25 DIAGNOSIS — R609 Edema, unspecified: Secondary | ICD-10-CM | POA: Insufficient documentation

## 2019-11-25 DIAGNOSIS — D229 Melanocytic nevi, unspecified: Secondary | ICD-10-CM | POA: Insufficient documentation

## 2019-12-23 ENCOUNTER — Encounter (INDEPENDENT_AMBULATORY_CARE_PROVIDER_SITE_OTHER): Payer: Medicare HMO | Admitting: Ophthalmology

## 2020-02-19 ENCOUNTER — Other Ambulatory Visit: Payer: Self-pay

## 2020-02-19 ENCOUNTER — Encounter: Payer: Self-pay | Admitting: Podiatry

## 2020-02-19 ENCOUNTER — Ambulatory Visit: Payer: Medicare HMO | Admitting: Podiatry

## 2020-02-19 DIAGNOSIS — M79676 Pain in unspecified toe(s): Secondary | ICD-10-CM

## 2020-02-19 DIAGNOSIS — E1142 Type 2 diabetes mellitus with diabetic polyneuropathy: Secondary | ICD-10-CM | POA: Diagnosis not present

## 2020-02-19 DIAGNOSIS — L84 Corns and callosities: Secondary | ICD-10-CM | POA: Diagnosis not present

## 2020-02-19 DIAGNOSIS — B351 Tinea unguium: Secondary | ICD-10-CM

## 2020-02-19 NOTE — Patient Instructions (Signed)
Apply Neosporin Ointment to right great toe once daily for one week for ingrown toenail.   Ingrown Toenail An ingrown toenail occurs when the corner or sides of a toenail grow into the surrounding skin. This causes discomfort and pain. The big toe is most commonly affected, but any of the toes can be affected. If an ingrown toenail is not treated, it can become infected. What are the causes? This condition may be caused by:  Wearing shoes that are too small or tight.  An injury, such as stubbing your toe or having your toe stepped on.  Improper cutting or care of your toenails.  Having nail or foot abnormalities that were present from birth (congenital abnormalities), such as having a nail that is too big for your toe. What increases the risk? The following factors may make you more likely to develop ingrown toenails:  Age. Nails tend to get thicker with age, so ingrown nails are more common among older people.  Cutting your toenails incorrectly, such as cutting them very short or cutting them unevenly. An ingrown toenail is more likely to get infected if you have:  Diabetes.  Blood flow (circulation) problems. What are the signs or symptoms? Symptoms of an ingrown toenail may include:  Pain, soreness, or tenderness.  Redness.  Swelling.  Hardening of the skin that surrounds the toenail. Signs that an ingrown toenail may be infected include:  Fluid or pus.  Symptoms that get worse instead of better. How is this diagnosed? An ingrown toenail may be diagnosed based on your medical history, your symptoms, and a physical exam. If you have fluid or blood coming from your toenail, a sample may be collected to test for the specific type of bacteria that is causing the infection. How is this treated? Treatment depends on how severe your ingrown toenail is. You may be able to care for your toenail at home.  If you have an infection, you may be prescribed antibiotic  medicines.  If you have fluid or pus draining from your toenail, your health care provider may drain it.  If you have trouble walking, you may be given crutches to use.  If you have a severe or infected ingrown toenail, you may need a procedure to remove part or all of the nail. Follow these instructions at home: Foot care   Do not pick at your toenail or try to remove it yourself.  Soak your foot in warm, soapy water. Do this for 20 minutes, 3 times a day, or as often as told by your health care provider. This helps to keep your toe clean and keep your skin soft.  Wear shoes that fit well and are not too tight. Your health care provider may recommend that you wear open-toed shoes while you heal.  Trim your toenails regularly and carefully. Cut your toenails straight across to prevent injury to the skin at the corners of the toenail. Do not cut your nails in a curved shape.  Keep your feet clean and dry to help prevent infection. Medicines  Take over-the-counter and prescription medicines only as told by your health care provider.  If you were prescribed an antibiotic, take it as told by your health care provider. Do not stop taking the antibiotic even if you start to feel better. Activity  Return to your normal activities as told by your health care provider. Ask your health care provider what activities are safe for you.  Avoid activities that cause pain. General instructions  If your health care provider told you to use crutches to help you move around, use them as instructed.  Keep all follow-up visits as told by your health care provider. This is important. Contact a health care provider if:  You have more redness, swelling, pain, or other symptoms that do not improve with treatment.  You have fluid, blood, or pus coming from your toenail. Get help right away if:  You have a red streak on your skin that starts at your foot and spreads up your leg.  You have a  fever. Summary  An ingrown toenail occurs when the corner or sides of a toenail grow into the surrounding skin. This causes discomfort and pain. The big toe is most commonly affected, but any of the toes can be affected.  If an ingrown toenail is not treated, it can become infected.  Fluid or pus draining from your toenail is a sign of infection. Your health care provider may need to drain it. You may be given antibiotics to treat the infection.  Trimming your toenails regularly and properly can help you prevent an ingrown toenail. This information is not intended to replace advice given to you by your health care provider. Make sure you discuss any questions you have with your health care provider. Document Revised: 07/20/2018 Document Reviewed: 12/14/2016 Elsevier Patient Education  [place].

## 2020-02-23 NOTE — Progress Notes (Signed)
Subjective: Tracy Elliott presents today preventative diabetic foot care and callus(es) b/l feet and painful mycotic toenails b/l that are difficult to trim. Pain interferes with ambulation. Aggravating factors include wearing enclosed shoe gear. Pain is relieved with periodic professional debridement.  Audley Hose, MD is patient's PCP. Last visit was: 01/03/2020.  Past Medical History:  Diagnosis Date  . Anemia   . Asthma    in the past  . Atrial fibrillation ([place])   . Chronic kidney disease   . Diabetes mellitus without complication ([place])    type 2  . Dysphagia   . GERD (gastroesophageal reflux disease)   . Hypertension   . Stroke [facility]) 12/2015   some right sided weakness, decreased vision on right     Current Outpatient Medications on File Prior to Visit  Medication Sig Dispense Refill  . aspirin 81 MG EC tablet Take 81 mg by mouth daily.  0  . ASPIRIN 81 PO aspirin 81 mg tablet,delayed release  TAKE 1 TABLET BY MOUTH EVERY DAY    . atorvastatin (LIPITOR) 80 MG tablet atorvastatin 80 mg tablet  Take 1 tablet(s) every day by oral route at bedtime for 90 days.    . BD ULTRA-FINE PEN NEEDLES 29G X 12.7MM MISC USE BEFORE MEALS AND AT BEDTIME  0  . Blood Glucose Monitoring Suppl (ONE TOUCH ULTRA 2) w/Device KIT **SIGN AOB USE TO TEST BLOOD SUGAR FOUR TIMES DAILY  0  . carvedilol (COREG) 25 MG tablet Take 12.5 mg by mouth 2 (two) times daily.   0  . cinacalcet (SENSIPAR) 30 MG tablet     . clotrimazole (LOTRIMIN) 1 % cream APPLY TO AFFECTED & SURROUNDING AREAS OF SKIN 2 TIMES DAILY IN THE MORNING AND EVENING  3  . diltiazem (CARDIZEM CD) 180 MG 24 hr capsule Take 180 mg by mouth daily.  0  . diltiazem (TIAZAC) 180 MG 24 hr capsule     . erythromycin ophthalmic ointment APPLY A SMALL AMOUNT INTO LEFT EYE THREE TIMES A DAY    . ferrous sulfate 324 MG TBEC ferrous sulfate 324 mg (65 mg iron) tablet,delayed release  Take 1 tablet every day by oral route at bedtime for 90 days.     . furosemide (LASIX) 40 MG tablet Take 40 mg by mouth 2 (two) times daily.    Marland Kitchen glucose blood test strip OneTouch Ultra Test strips  Take 1 strip(s) 4 times a day by miscell. route for 30 days.    . hydrALAZINE (APRESOLINE) 50 MG tablet Take 50 mg by mouth 2 (two) times daily.   0  . insulin aspart (NOVOLOG FLEXPEN) 100 UNIT/ML FlexPen Inject 2-12 Units into the skin 4 (four) times daily -  with meals and at bedtime. Per sliding scale    . Insulin Glargine (BASAGLAR KWIKPEN) 100 UNIT/ML SOPN Basaglar KwikPen U-100 Insulin 100 unit/mL (3 mL) subcutaneous  Inject 24 unit(s) every day by subcutaneous route in the morning for 30 days.    . Insulin Pen Needle 29G X 12.7MM MISC BD Ultra-Fine Original Pen Needle 29 gauge x 1/2"    . iron polysaccharides (NIFEREX) 150 MG capsule Take 150 mg by mouth at bedtime.    . Lancets (ONETOUCH ULTRASOFT) lancets **SIGN AOB ** USE TO TEST BLOOD SUGAR BEFORE MEALS AT BEDTIME  0  . ONE TOUCH ULTRA TEST test strip TAKE 1 STRIP(S) 4 TIMES A DAY DX CODE E11.65  3  . pantoprazole (PROTONIX) 40 MG tablet Take 40 mg  by mouth daily.  0  . polyethylene glycol powder (MIRALAX) 17 GM/SCOOP powder Miralax 17 gram oral powder packet  Take 1 packet every day by oral route as needed.    . senna-docusate (SENOKOT-S) 8.6-50 MG tablet Senna with Docusate Sodium 8.6 mg-50 mg tablet  Take 2 tablets every day by oral route at bedtime.    Marland Kitchen warfarin (COUMADIN) 5 MG tablet warfarin 5 mg tablet  USE AS DIRECTED 5MG + 2MG = 7MG ON M/W/F ALTERNATE WITH 5MG + 3MG = 8MG ON TUES/ THURS/SAT/SUN     No current facility-administered medications on file prior to visit.     Allergies  Allergen Reactions  . Morphine   . Morphine And Related Other (See Comments)    confusion  . Oxycodone Other (See Comments)    confusion  . Penicillins     Has patient had a PCN reaction causing immediate rash, facial/tongue/throat swelling, SOB or lightheadedness with hypotension: Unknown Has patient had  a PCN reaction causing severe rash involving mucus membranes or skin necrosis: Unknown Has patient had a PCN reaction that required hospitalization: Unknown Has patient had a PCN reaction occurring within the last 10 years: Unknown If all of the above answers are "NO", then may proceed with Cephalosporin use.    Objective: Tracy Elliott is a pleasant 74 y.o. African American female in NAD. AAO x 3.   There were no vitals filed for this visit.  Vascular Examination: Capillary refill time to digits immediate b/l. Faintly palpable pedal pulses b/l. Pedal hair absent b/l Skin temperature gradient within normal limits b/l.  Dermatological Examination: Pedal skin with normal turgor, texture and tone bilaterally. No open wounds bilaterally. No interdigital macerations bilaterally. Toenails 1-5 b/l elongated, discolored, dystrophic, thickened, crumbly with subungual debris and tenderness to dorsal palpation. Incurvated nailplate lateral border(s) R hallux.  Nail border hypertrophy present. There is tenderness to palpation. Sign(s) of infection: no clinical signs of infection noted on examination today.. Hyperkeratotic lesion(s) L hallux, R hallux and plantar aspect b/l heel pads.  No erythema, no edema, no drainage, no fluctuance. Nevus noted plantar aspect right foot measuring 0.7 x 0.5 cm with asymmetry, irregular borders, black/brown in color, no depth, no elevation, no bleeding.  Musculoskeletal: Normal muscle strength 5/5 to all lower extremity muscle groups bilaterally. No gross bony deformities bilaterally. No pain crepitus or joint limitation noted with ROM b/l.  Neurological Examination: Protective sensation diminished with 10g monofilament b/l.  Assessment: 1. Pain due to onychomycosis of toenail   2. Callus   3. Diabetic peripheral neuropathy associated with type 2 diabetes mellitus ([place])     Plan: -Examined patient. -Continue diabetic foot care principles. Literature dispensed on  today.  -Toenails 1-5 b/l were debrided in length and girth with sterile nail nippers and dremel without iatrogenic bleeding. Offending nail borders debrided and curretaged right great toe. Border cleansed with alcohol. Antibiotic ointment applied. Patient was instructed to apply Neosporin to right great toe once daily for one week. -Callus(es) L hallux, R hallux and plantar aspect b/l heel pads pared utilizing sterile scalpel blade without complication or incident. Total number debrided =4. -Patient to continue soft, supportive shoe gear daily. -Patient to report any pedal injuries to medical professional immediately. -Patient/POA to call should there be question/concern in the interim.  Return in about 3 months (around 05/21/2020) for diabetic foot care.  Marzetta Board, DPM

## 2020-03-30 ENCOUNTER — Other Ambulatory Visit: Payer: Self-pay | Admitting: Internal Medicine

## 2020-03-30 ENCOUNTER — Ambulatory Visit
Admission: RE | Admit: 2020-03-30 | Discharge: 2020-03-30 | Disposition: A | Discharge status: Home / Self Care | Payer: Medicare HMO | Source: Ambulatory Visit | Attending: Internal Medicine | Admitting: Internal Medicine

## 2020-03-30 DIAGNOSIS — R053 Chronic cough: Secondary | ICD-10-CM

## 2020-06-02 ENCOUNTER — Encounter: Payer: Self-pay | Admitting: Podiatry

## 2020-06-02 ENCOUNTER — Ambulatory Visit: Payer: Medicare HMO | Admitting: Podiatry

## 2020-06-02 ENCOUNTER — Other Ambulatory Visit: Payer: Self-pay

## 2020-06-02 DIAGNOSIS — E1142 Type 2 diabetes mellitus with diabetic polyneuropathy: Secondary | ICD-10-CM

## 2020-06-02 DIAGNOSIS — M79676 Pain in unspecified toe(s): Secondary | ICD-10-CM

## 2020-06-02 DIAGNOSIS — L84 Corns and callosities: Secondary | ICD-10-CM

## 2020-06-02 DIAGNOSIS — B351 Tinea unguium: Secondary | ICD-10-CM

## 2020-06-06 NOTE — Progress Notes (Signed)
Subjective: FALON HUESCA presents today preventative diabetic foot care and callus(es) b/l feet and painful mycotic toenails b/l that are difficult to trim. Pain interferes with ambulation. Aggravating factors include wearing enclosed shoe gear. Pain is relieved with periodic professional debridement.   Patient states her asthma is bothering her today.  Audley Hose, MD is patient's PCP. Last visit was: 01/03/2020.   Allergies  Allergen Reactions  . Morphine   . Morphine And Related Other (See Comments)    confusion  . Oxycodone Other (See Comments)    confusion  . Penicillins     Has patient had a PCN reaction causing immediate rash, facial/tongue/throat swelling, SOB or lightheadedness with hypotension: Unknown Has patient had a PCN reaction causing severe rash involving mucus membranes or skin necrosis: Unknown Has patient had a PCN reaction that required hospitalization: Unknown Has patient had a PCN reaction occurring within the last 10 years: Unknown If all of the above answers are "NO", then may proceed with Cephalosporin use.    Objective: MONTRICE MONTUORI is a pleasant 75 y.o. African American female in NAD. AAO x 3.   Pulse Oximeter reading 98% on room air.  Vascular Examination: Capillary refill time to digits immediate b/l. Faintly palpable pedal pulses b/l. Pedal hair absent b/l Skin temperature gradient within normal limits b/l.  Dermatological Examination: Pedal skin with normal turgor, texture and tone bilaterally. No open wounds bilaterally. No interdigital macerations bilaterally. Toenails 1-5 b/l elongated, discolored, dystrophic, thickened, crumbly with subungual debris and tenderness to dorsal palpation. Incurvated nailplate lateral border(s) R hallux.  Nail border hypertrophy present. There is tenderness to palpation. Sign(s) of infection: no clinical signs of infection noted on examination today.. Hyperkeratotic lesion(s) L hallux, R hallux and plantar  aspect b/l heel pads.  No erythema, no edema, no drainage, no fluctuance. Nevus noted plantar aspect right foot measuring 0.7 x 0.5 cm with asymmetry, irregular borders, black/brown in color, no depth, no elevation, no bleeding. No change in size/characteristics.  Musculoskeletal: Normal muscle strength 5/5 to all lower extremity muscle groups bilaterally. No gross bony deformities bilaterally. No pain crepitus or joint limitation noted with ROM b/l.  Neurological Examination: Protective sensation diminished with 10g monofilament b/l.  Assessment: 1. Pain due to onychomycosis of toenail   2. Callus   3. Diabetic peripheral neuropathy associated with type 2 diabetes mellitus ([place])     Plan: -Examined patient. -Continue diabetic foot care principles. Literature dispensed on today.  -Toenails 1-5 b/l were debrided in length and girth with sterile nail nippers and dremel without iatrogenic bleeding. Offending nail borders debrided and curretaged right great toe. Border cleansed with alcohol. Antibiotic ointment applied. No further treatment required by patient. -Callus(es) L hallux, R hallux and plantar aspect b/l heel pads pared utilizing sterile scalpel blade without complication or incident. Total number debrided =4. -Patient to continue soft, supportive shoe gear daily. -Patient to report any pedal injuries to medical professional immediately. -Patient/POA to call should there be question/concern in the interim.  Return in about 3 months (around 08/30/2020).  Marzetta Board, DPM

## 2020-07-17 IMAGING — MG DIGITAL SCREENING BILAT W/ TOMO W/ CAD
8 series · 9 of 24 positions shown · non-contrast
Comparison: None.

CLINICAL DATA: Screening.

EXAM:
DIGITAL SCREENING BILATERAL MAMMOGRAM WITH TOMO AND CAD

[R CC synth-2D]
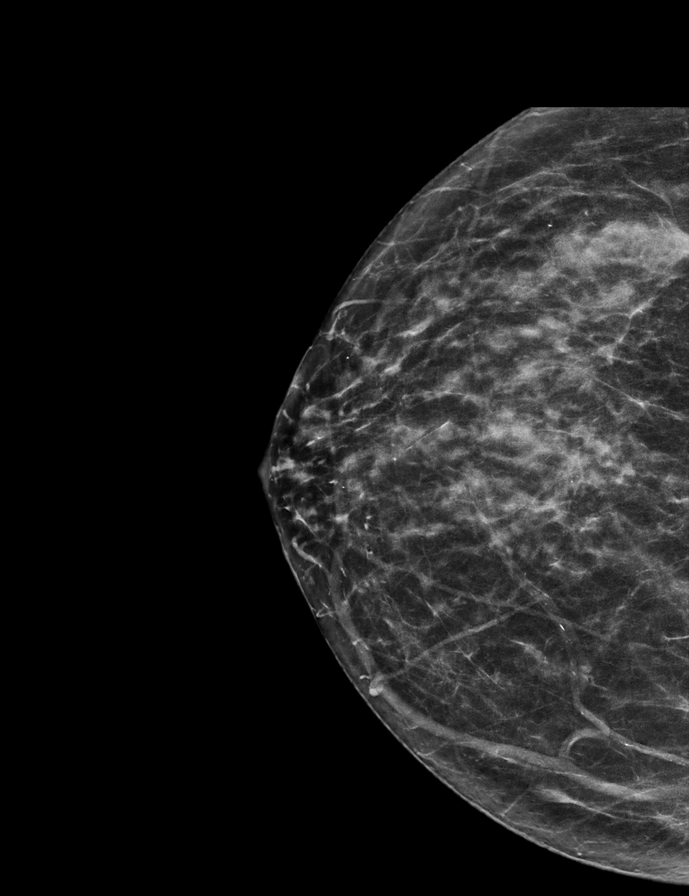

[R MLO synth-2D]
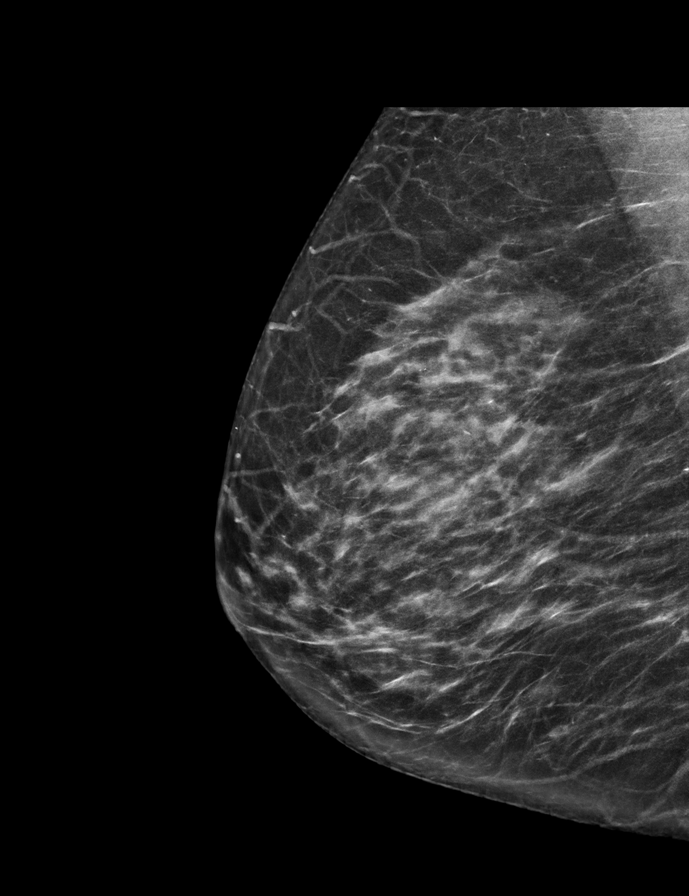

[L CC synth-2D]
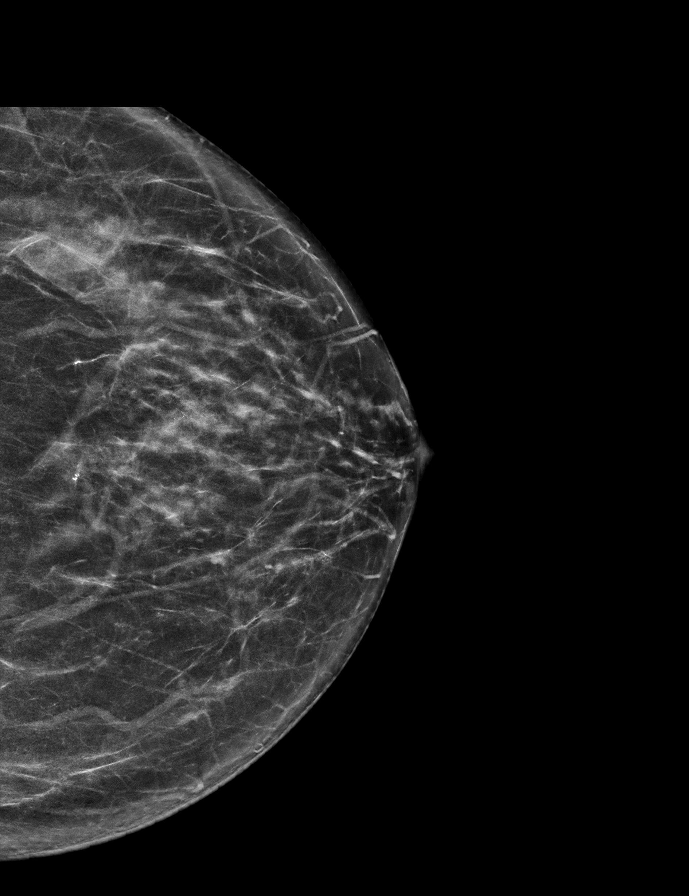

[L MLO synth-2D]
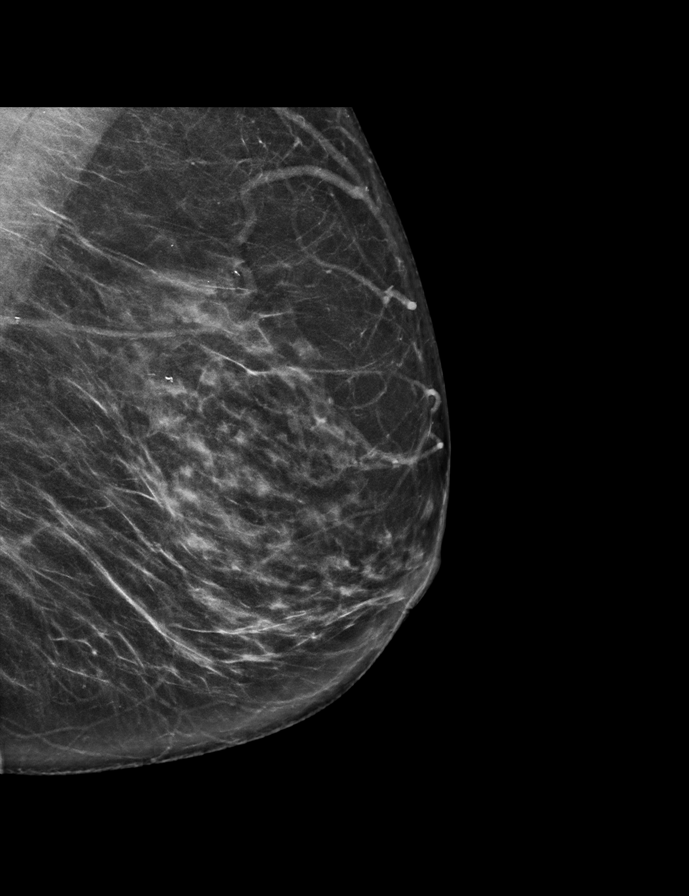

[L CC tomo · 2 of 65 frames shown]
[frame 21/65]
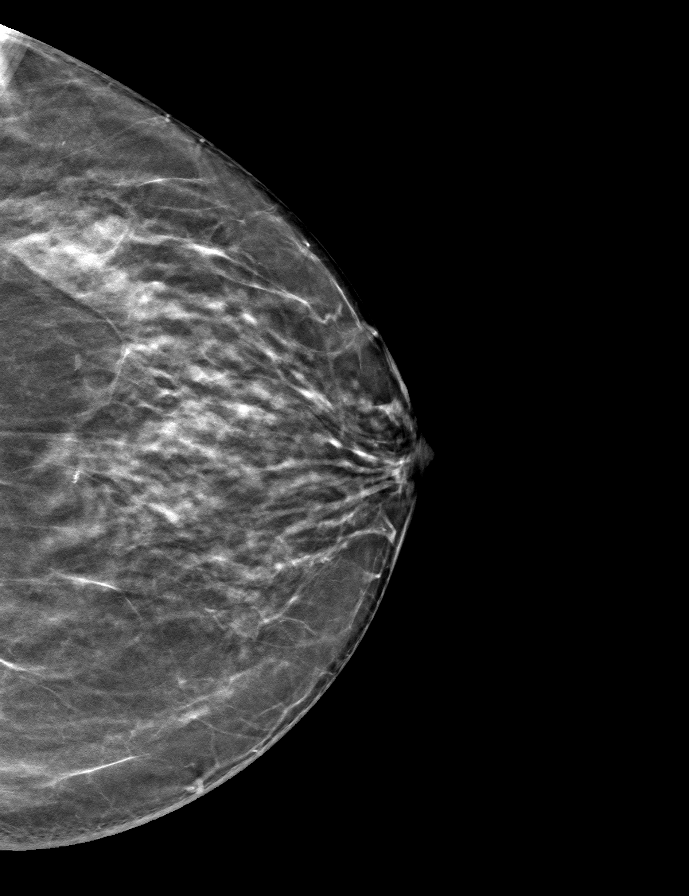
[frame 33/65]
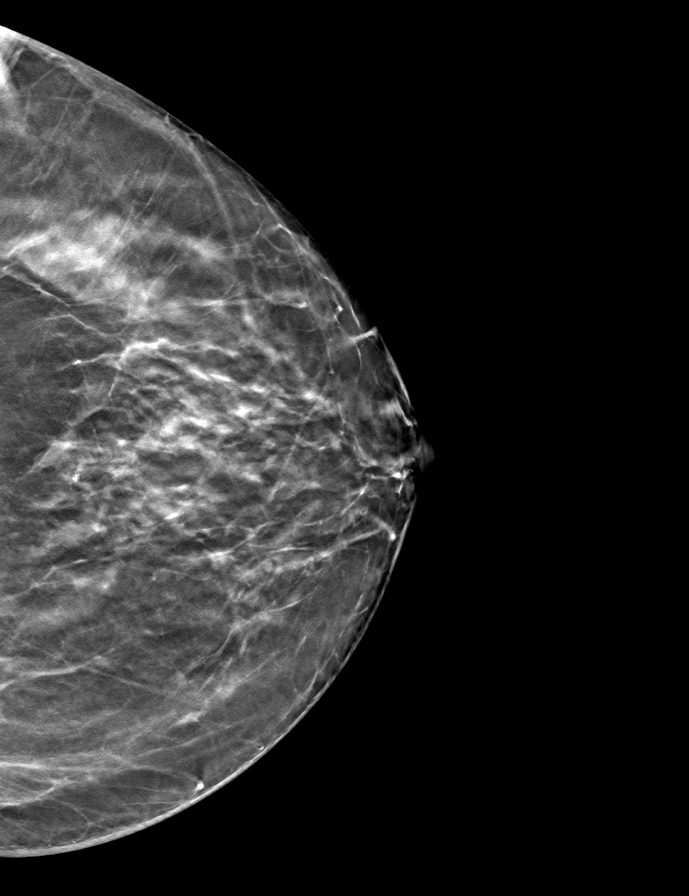

[R MLO tomo · tomo slice 36/71.0]
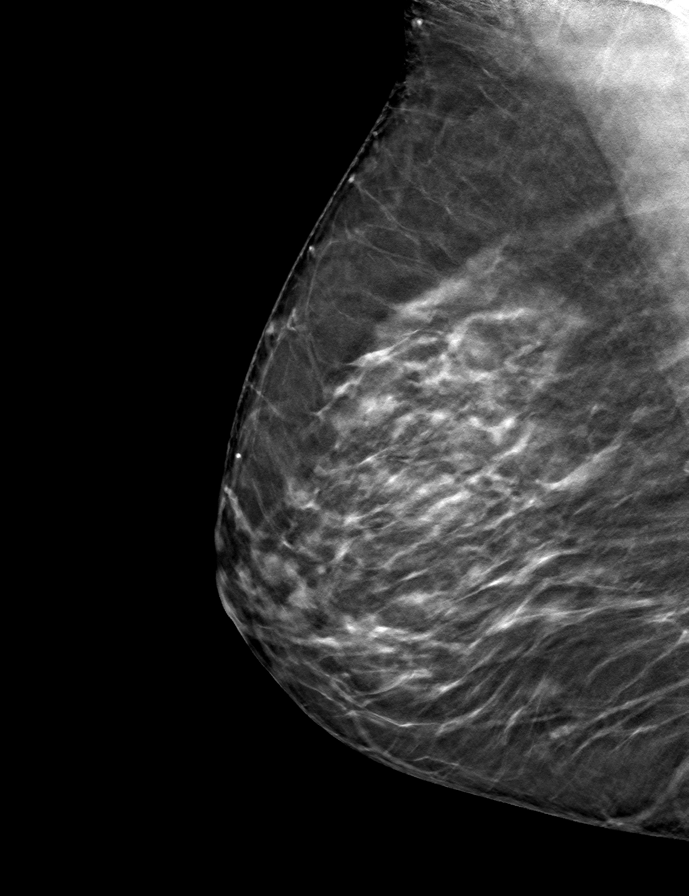

[L MLO tomo · tomo slice 37/72.0]
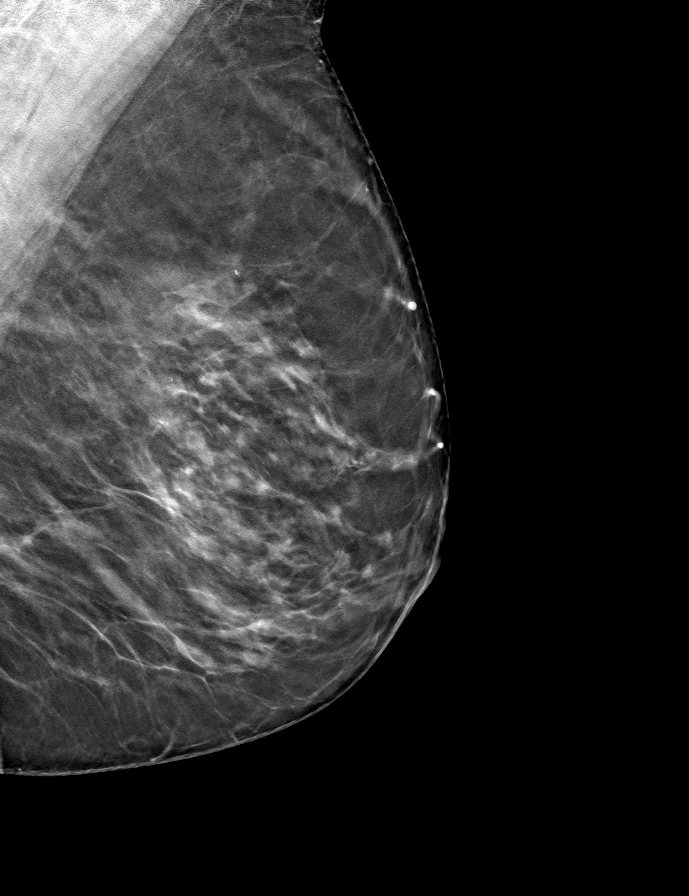

[R CC tomo · tomo slice 30/59.0]
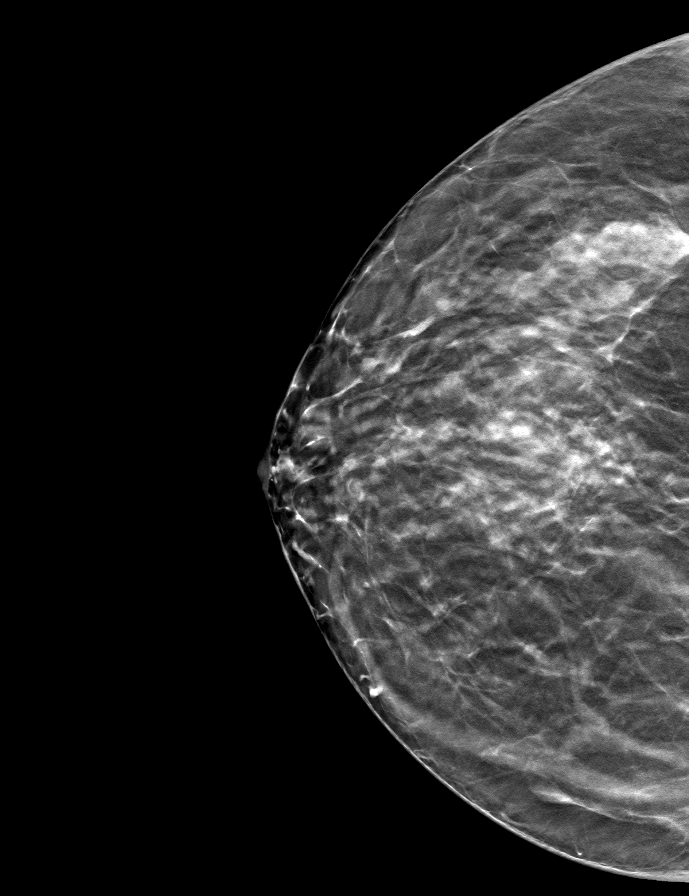

[9 of 24 positions shown; findings below may reference images not displayed]

ACR Breast Density Category c: The breast tissue is heterogeneously
dense, which may obscure small masses
FINDINGS: There are no findings suspicious for malignancy. Images were
processed with CAD.
IMPRESSION: No mammographic evidence of malignancy. A result letter of this
screening mammogram will be mailed directly to the patient.

RECOMMENDATION:
Screening mammogram in one year. (Code:EM-2-IHY)

BI-RADS CATEGORY  1: Negative.

## 2020-08-23 DIAGNOSIS — Z8639 Personal history of other endocrine, nutritional and metabolic disease: Secondary | ICD-10-CM | POA: Insufficient documentation

## 2020-08-23 DIAGNOSIS — N2581 Secondary hyperparathyroidism of renal origin: Secondary | ICD-10-CM | POA: Insufficient documentation

## 2020-09-15 ENCOUNTER — Ambulatory Visit: Payer: Medicare HMO | Admitting: Podiatry

## 2020-10-02 ENCOUNTER — Other Ambulatory Visit: Payer: Self-pay

## 2020-10-02 ENCOUNTER — Ambulatory Visit: Payer: Medicare HMO | Admitting: Podiatry

## 2020-10-02 ENCOUNTER — Encounter: Payer: Self-pay | Admitting: Podiatry

## 2020-10-02 DIAGNOSIS — E1142 Type 2 diabetes mellitus with diabetic polyneuropathy: Secondary | ICD-10-CM | POA: Diagnosis not present

## 2020-10-02 DIAGNOSIS — L84 Corns and callosities: Secondary | ICD-10-CM | POA: Diagnosis not present

## 2020-10-02 DIAGNOSIS — M79676 Pain in unspecified toe(s): Secondary | ICD-10-CM

## 2020-10-02 DIAGNOSIS — B351 Tinea unguium: Secondary | ICD-10-CM | POA: Diagnosis not present

## 2020-10-04 ENCOUNTER — Encounter: Payer: Self-pay | Admitting: Podiatry

## 2020-10-04 NOTE — Progress Notes (Signed)
Subjective:  Patient ID: SHANDEL BUSIC, female    DOB: 1945-11-01,  MRN: [id number]  75 y.o. female presents with preventative diabetic foot care and callus(es) b/l feet and painful thick toenails that are difficult to trim. Painful toenails interfere with ambulation. Aggravating factors include wearing enclosed shoe gear. Pain is relieved with periodic professional debridement. Painful calluses are aggravated when weightbearing with and without shoegear. Pain is relieved with periodic professional debridement..    Patient does not remember her glucose reading on today's visit. She states "It wasn't good." States she was brought to appointment by her niece's stepdaughter on today.   PCP: Audley Hose, MD and last visit was: 09/03/2020.  Review of Systems: Negative except as noted in the HPI.   Allergies  Allergen Reactions   Morphine    Morphine And Related Other (See Comments)    confusion   Oxycodone Other (See Comments)    confusion   Penicillins     Has patient had a PCN reaction causing immediate rash, facial/tongue/throat swelling, SOB or lightheadedness with hypotension: Unknown Has patient had a PCN reaction causing severe rash involving mucus membranes or skin necrosis: Unknown Has patient had a PCN reaction that required hospitalization: Unknown Has patient had a PCN reaction occurring within the last 10 years: Unknown If all of the above answers are "NO", then may proceed with Cephalosporin use.    Objective:  There were no vitals filed for this visit. Constitutional Patient is a pleasant 75 y.o. African American female WD, WN in NAD. AAO x 3.  Vascular Capillary fill time to digits <3 seconds b/l lower extremities. Faintly palpable DP pulse(s) b/l lower extremities. Faintly palpable PT pulse(s) b/l lower extremities. Pedal hair absent. Lower extremity skin temperature gradient within normal limits. No pain with calf compression b/l. No cyanosis or clubbing noted.   Neurologic Normal speech. Protective sensation diminished with 10g monofilament b/l. Vibratory sensation decreased b/l.  Dermatologic Pedal skin with normal turgor, texture and tone b/l lower extremities No open wounds b/l lower extremities No interdigital macerations b/l lower extremities Toenails 1-5 b/l elongated, discolored, dystrophic, thickened, crumbly with subungual debris and tenderness to dorsal palpation. Hyperkeratotic lesion(s) L hallux, R hallux, and plantar aspect of heel b/l feet.  No erythema, no edema, no drainage, no fluctuance. No change in nevus plantar aspect of right foot.  Orthopedic: Normal muscle strength 5/5 to all lower extremity muscle groups bilaterally. No pain crepitus or joint limitation noted with ROM b/l. No gross bony deformities bilaterally.    Assessment:   1. Pain due to onychomycosis of toenail   2. Callus   3. Diabetic peripheral neuropathy associated with type 2 diabetes mellitus ([place])    Plan:  Patient was evaluated and treated and all questions answered.  Onychomycosis with pain -Nails palliatively debridement as below. -Educated on self-care  Procedure: Nail Debridement Rationale: Pain Type of Debridement: manual, sharp debridement. Instrumentation: Nail nipper, rotary burr. Number of Nails: 10  -Examined patient. -Continue diabetic foot care principles. -Patient to continue soft, supportive shoe gear daily. -Toenails 1-5 b/l were debrided in length and girth with sterile nail nippers and dremel without iatrogenic bleeding.  -Callus(es) L hallux, R hallux, and plantar aspect of heel b/l feet pared utilizing sterile scalpel blade without complication or incident. Total number debrided =4. -Patient to report any pedal injuries to medical professional immediately. -Patient/POA to call should there be question/concern in the interim.  Return in about 3 months (around 01/02/2021).  Marzetta Board,  DPM

## 2021-01-20 ENCOUNTER — Other Ambulatory Visit: Payer: Self-pay | Admitting: Internal Medicine

## 2021-01-20 DIAGNOSIS — E049 Nontoxic goiter, unspecified: Secondary | ICD-10-CM

## 2021-01-22 ENCOUNTER — Encounter: Payer: Self-pay | Admitting: Podiatry

## 2021-01-22 ENCOUNTER — Ambulatory Visit (INDEPENDENT_AMBULATORY_CARE_PROVIDER_SITE_OTHER): Payer: Medicare HMO | Admitting: Podiatry

## 2021-01-22 ENCOUNTER — Other Ambulatory Visit: Payer: Self-pay

## 2021-01-22 DIAGNOSIS — M79676 Pain in unspecified toe(s): Secondary | ICD-10-CM

## 2021-01-22 DIAGNOSIS — L84 Corns and callosities: Secondary | ICD-10-CM | POA: Diagnosis not present

## 2021-01-22 DIAGNOSIS — B351 Tinea unguium: Secondary | ICD-10-CM

## 2021-01-22 DIAGNOSIS — E1142 Type 2 diabetes mellitus with diabetic polyneuropathy: Secondary | ICD-10-CM | POA: Diagnosis not present

## 2021-01-24 NOTE — Progress Notes (Signed)
  Subjective:  Patient ID: Tracy Elliott, female    DOB: 03/19/1946,  MRN: [id number]  75 y.o. female presents with preventative diabetic foot care and callus(es) b/l feet and painful thick toenails that are difficult to trim. Painful toenails interfere with ambulation. Aggravating factors include wearing enclosed shoe gear. Pain is relieved with periodic professional debridement. Painful calluses are aggravated when weightbearing with and without shoegear. Pain is relieved with periodic professional debridement..    Patient does not remember her glucose reading on today's visit.   PCP: Audley Hose, MD and last visit was: 01/19/2021  Review of Systems: Negative except as noted in the HPI.   Allergies  Allergen Reactions   Morphine    Morphine And Related Other (See Comments)    confusion   Oxycodone Other (See Comments)    confusion   Penicillins     Has patient had a PCN reaction causing immediate rash, facial/tongue/throat swelling, SOB or lightheadedness with hypotension: Unknown Has patient had a PCN reaction causing severe rash involving mucus membranes or skin necrosis: Unknown Has patient had a PCN reaction that required hospitalization: Unknown Has patient had a PCN reaction occurring within the last 10 years: Unknown If all of the above answers are "NO", then may proceed with Cephalosporin use.    Objective:  There were no vitals filed for this visit. Constitutional Patient is a pleasant 75 y.o. African American female WD, WN in NAD. AAO x 3.  Vascular Capillary fill time to digits <3 seconds b/l lower extremities. Faintly palpable DP pulse(s) b/l lower extremities. Faintly palpable PT pulse(s) b/l lower extremities. Pedal hair absent. Lower extremity skin temperature gradient within normal limits. No pain with calf compression b/l. No cyanosis or clubbing noted.  Neurologic Normal speech. Protective sensation diminished with 10g monofilament b/l. Vibratory sensation  decreased b/l.  Dermatologic Pedal skin with normal turgor, texture and tone b/l lower extremities No open wounds b/l lower extremities No interdigital macerations b/l lower extremities Toenails 1-5 b/l elongated, discolored, dystrophic, thickened, crumbly with subungual debris and tenderness to dorsal palpation. Hyperkeratotic lesion(s) L hallux, R hallux, and plantar aspect of heel b/l feet.  No erythema, no edema, no drainage, no fluctuance. No change in nevus plantar aspect of right foot.  Orthopedic: Normal muscle strength 5/5 to all lower extremity muscle groups bilaterally. No pain crepitus or joint limitation noted with ROM b/l. No gross bony deformities bilaterally.    Assessment:   1. Pain due to onychomycosis of toenail   2. Callus   3. Diabetic peripheral neuropathy associated with type 2 diabetes mellitus ([place])    Plan:  -No new findings. No new orders. -Continue diabetic foot care principles: inspect feet daily, monitor glucose as recommended by PCP and/or Endocrinologist, and follow prescribed diet per PCP, Endocrinologist and/or dietician. -Patient to continue soft, supportive shoe gear daily. -Toenails 1-5 b/l were debrided in length and girth with sterile nail nippers and dremel without iatrogenic bleeding.  -Callus(es) L hallux, R hallux, and plantar aspect of heel b/l feet pared utilizing sterile scalpel blade without complication or incident. Total number debrided =4. -Patient to report any pedal injuries to medical professional immediately. -Patient/POA to call should there be question/concern in the interim.  Return in about 3 months (around 04/24/2021).  Marzetta Board, DPM

## 2021-02-01 ENCOUNTER — Ambulatory Visit
Admission: RE | Admit: 2021-02-01 | Discharge: 2021-02-01 | Disposition: A | Discharge status: Home / Self Care | Payer: Medicare HMO | Source: Ambulatory Visit | Attending: Internal Medicine | Admitting: Internal Medicine

## 2021-02-01 DIAGNOSIS — E049 Nontoxic goiter, unspecified: Secondary | ICD-10-CM

## 2021-02-10 ENCOUNTER — Other Ambulatory Visit: Payer: Self-pay | Admitting: Internal Medicine

## 2021-02-10 DIAGNOSIS — E042 Nontoxic multinodular goiter: Secondary | ICD-10-CM

## 2021-03-10 ENCOUNTER — Other Ambulatory Visit: Payer: Medicare HMO

## 2021-05-04 ENCOUNTER — Ambulatory Visit: Payer: Medicare HMO | Admitting: Podiatry

## 2022-04-21 IMAGING — US US THYROID
1 series · 16 of 25 positions shown · non-contrast
Comparison: None.

CLINICAL DATA: Goiter.

EXAM:
THYROID ULTRASOUND
TECHNIQUE: Ultrasound examination of the thyroid gland and adjacent soft
tissues was performed.

[Series 1: us thyroid · 0.07mm/px · 16 of 82 slices shown]
[im 1/82]
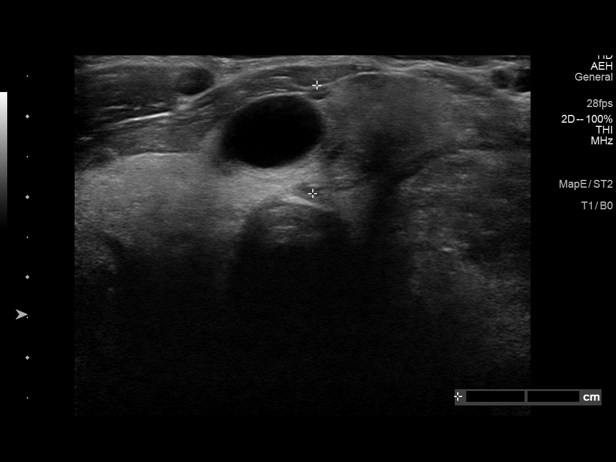
[im 7/82]
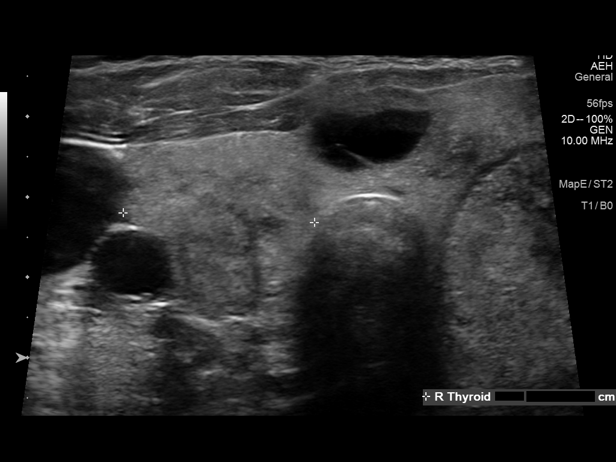
[im 11/82]
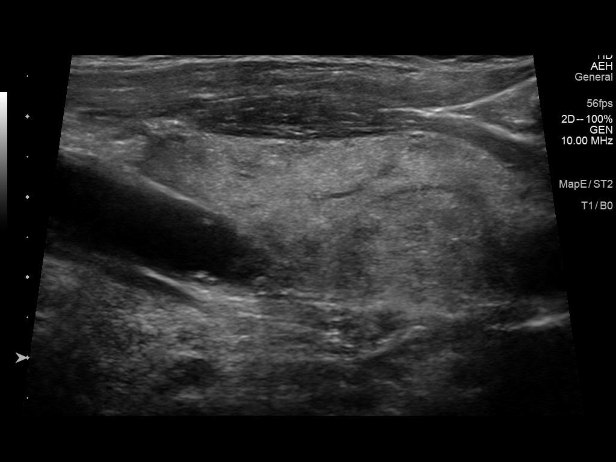
[im 17/82]
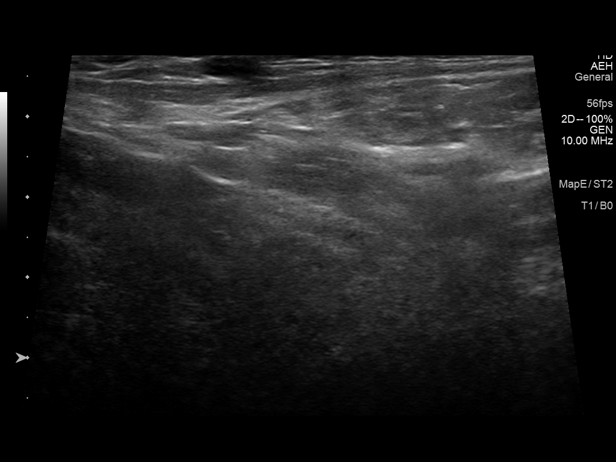
[im 24/82]
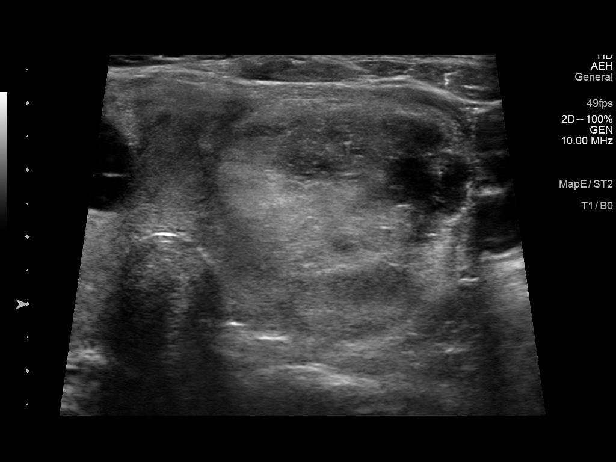
[im 28/82]
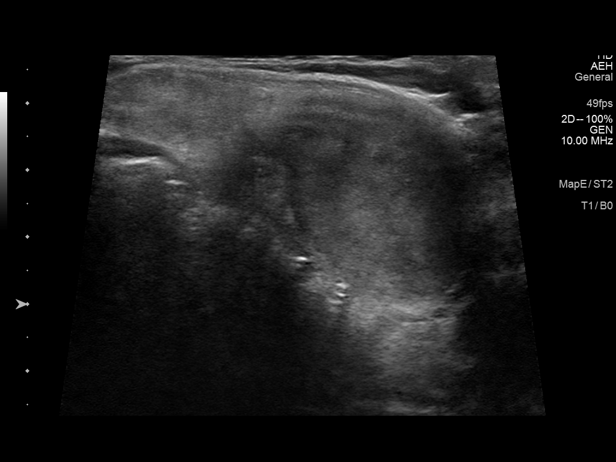
[im 34/82]
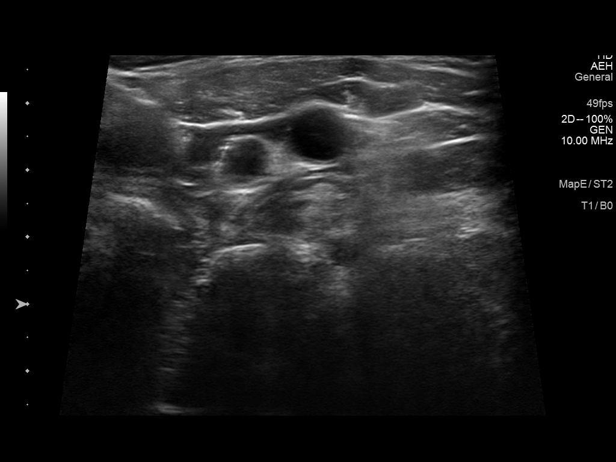
[im 38/82]
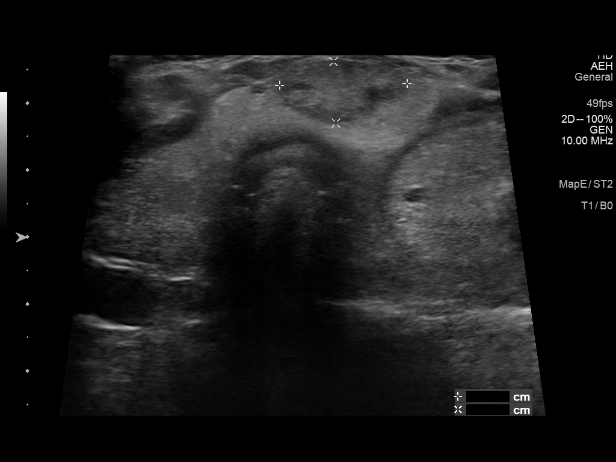
[im 44/82]
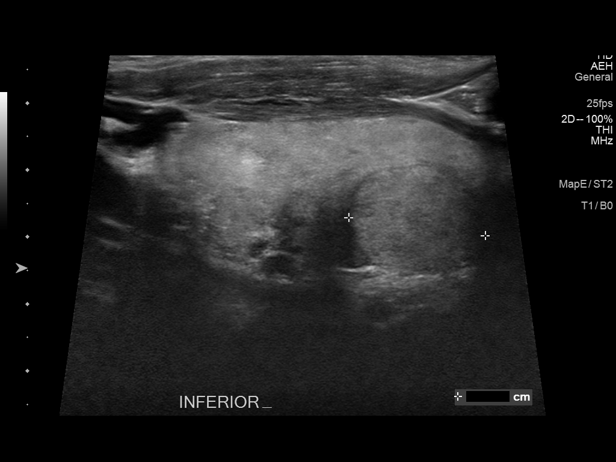
[im 48/82]
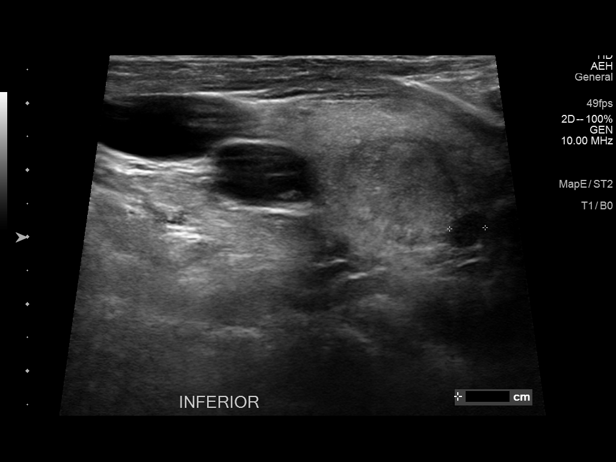
[im 55/82]
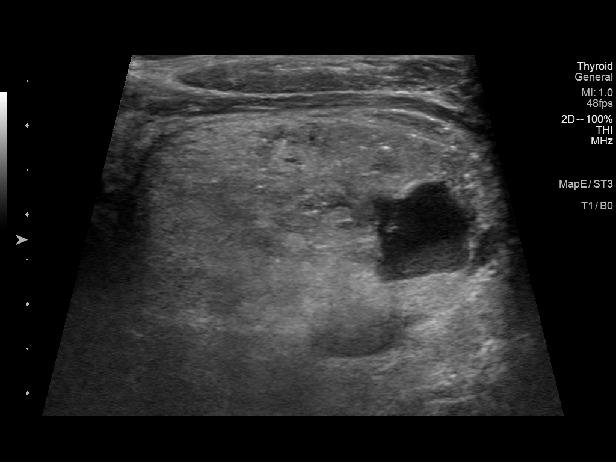
[im 58/82]
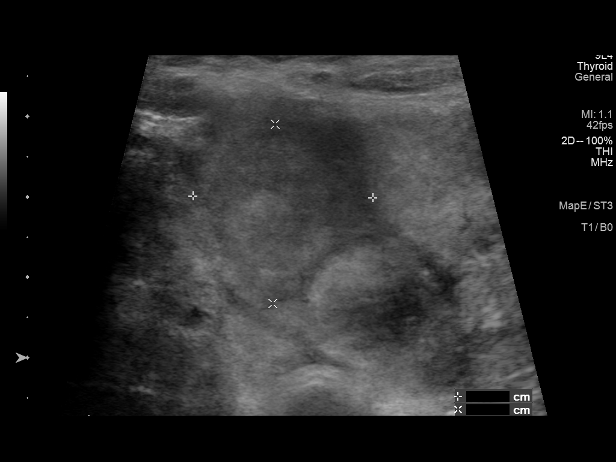
[im 65/82]
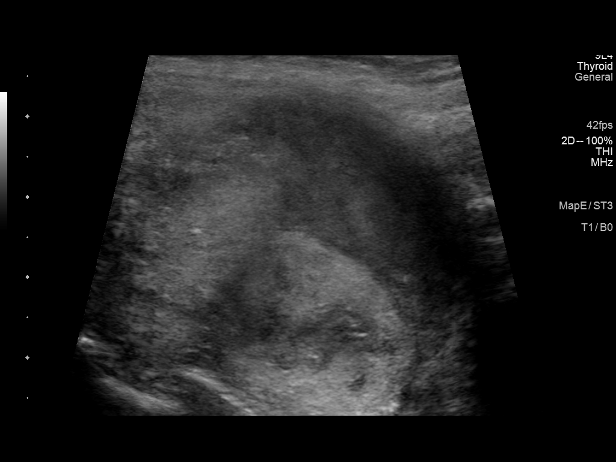
[im 71/82]
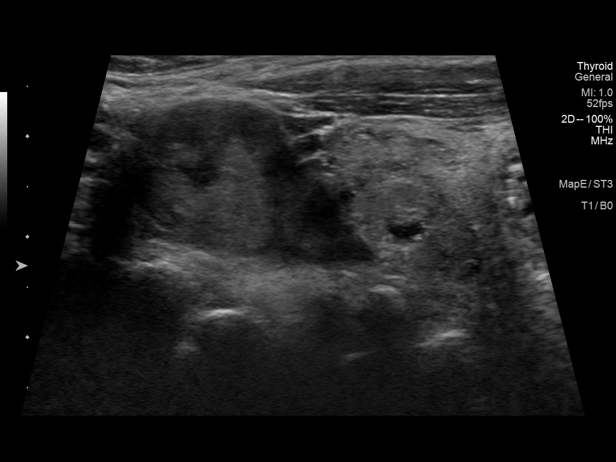
[im 75/82]
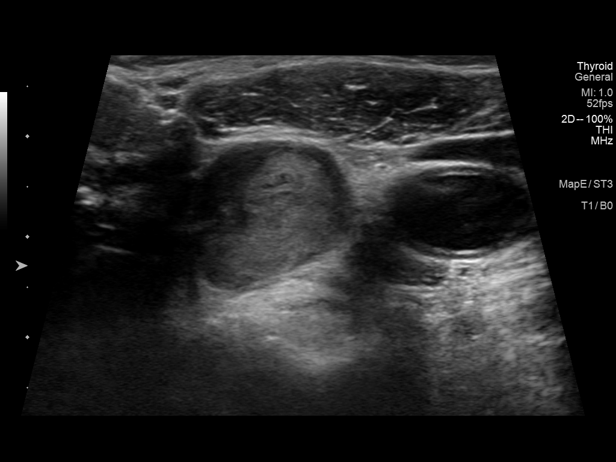
[im 82/82]
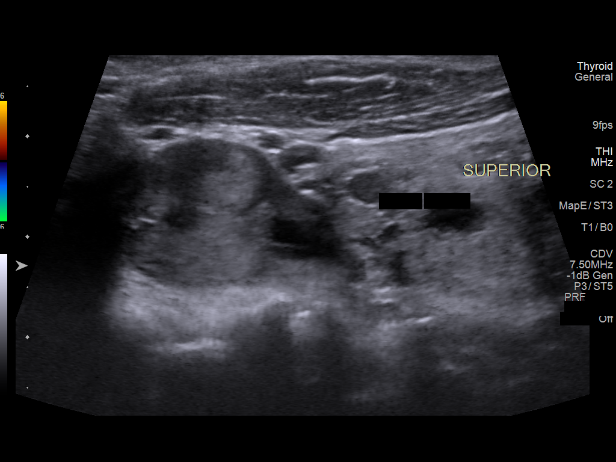

[16 of 25 positions shown; findings below may reference images not displayed]

FINDINGS: Parenchymal Echotexture: Moderately heterogenous

Isthmus: 1.4 cm

Right lobe: 5.5 x 2.3 x 2.4 cm

Left lobe: 6.9 x 3.8 x 4.1 cm

_________________________________________________________

Estimated total number of nodules >/= 1 cm: 6-10

Number of spongiform nodules >/=  2 cm not described below (TR1): 0

Number of mixed cystic and solid nodules >/= 1.5 cm not described
below (TR2): 0

_________________________________________________________

Nodule # 1:

Location: Isthmus; Superior

Maximum size: 1.9 cm; Other 2 dimensions: 1.8 x 0.9 cm

Composition: solid/almost completely solid (2)

Echogenicity: hypoechoic (2)

Shape: not taller-than-wide (0)

Margins: smooth (0)

Echogenic foci: none (0)

ACR TI-RADS total points: 4.

ACR TI-RADS risk category: TR4 (4-6 points).

ACR TI-RADS recommendations:

**Given size (>/= 1.5 cm) and appearance, fine needle aspiration of
this moderately suspicious nodule should be considered based on
TI-RADS criteria.

_________________________________________________________

Nodule # 2: Minimally complex cystic nodule in the mid aspect of the
thyroid isthmus measures up to 1.6 cm. This is considered
sonographically low risk/benign and does not warrant further
evaluation.

_________________________________________________________

Nodule # 3:

Location: Right; Inferior

Maximum size: 2.1 cm; Other 2 dimensions: 1.6 x 1.5 cm

Composition: solid/almost completely solid (2)

Echogenicity: isoechoic (1)

Shape: not taller-than-wide (0)

Margins: smooth (0)

Echogenic foci: none (0)

ACR TI-RADS total points: 3.

ACR TI-RADS risk category: TR3 (3 points).

ACR TI-RADS recommendations:

*Given size (>/= 1.5 - 2.4 cm) and appearance, a follow-up
ultrasound in 1 year should be considered based on TI-RADS criteria.

_________________________________________________________

Nodule # 4: Small subcentimeter hypoechoic nodule in the inferior
most right thyroid gland does not warrant further evaluation.

_________________________________________________________

Nodule # 5:

Location: Left; Mid

Maximum size: 4.2 cm; Other 2 dimensions: 4.0 x 2.6 cm

Composition: solid/almost completely solid (2)

Echogenicity: isoechoic (1)

Shape: not taller-than-wide (0)

Margins: smooth (0)

Echogenic foci: punctate echogenic foci (3)

ACR TI-RADS total points: 6.

ACR TI-RADS risk category: TR4 (4-6 points).

ACR TI-RADS recommendations:

**Given size (>/= 1.5 cm) and appearance, fine needle aspiration of
this moderately suspicious nodule should be considered based on
TI-RADS criteria.

_________________________________________________________

Nodule # 6:

Location: Left; Inferior

Maximum size: 3.0 cm; Other 2 dimensions: 2.2 x 2.2 cm

Composition: solid/almost completely solid (2)

Echogenicity: hypoechoic (2)

Shape: not taller-than-wide (0)

Margins: ill-defined (0)

Echogenic foci: none (0)

ACR TI-RADS total points: 4.

ACR TI-RADS risk category: TR4 (4-6 points).

ACR TI-RADS recommendations:

**Given size (>/= 1.5 cm) and appearance, fine needle aspiration of
this moderately suspicious nodule should be considered based on
TI-RADS criteria.

_________________________________________________________

Nodule # 7:

Location: Left; Inferior

Maximum size: 1.9 cm; Other 2 dimensions: 1.9 x 1.1 cm

Composition: solid/almost completely solid (2)

Echogenicity: hypoechoic (2)

Shape: not taller-than-wide (0)

Margins: smooth (0)

Echogenic foci: none (0)

ACR TI-RADS total points: 4.

ACR TI-RADS risk category: TR4 (4-6 points).

ACR TI-RADS recommendations:

**Given size (>/= 1.5 cm) and appearance, fine needle aspiration of
this moderately suspicious nodule should be considered based on
TI-RADS criteria.

_________________________________________________________

Nodule # 8:

Location: Left; Inferior

Maximum size: 2.5 cm; Other 2 dimensions: 2.0 x 1.5 cm

Composition: solid/almost completely solid (2)

Echogenicity: isoechoic (1)

Shape: not taller-than-wide (0)

Margins: ill-defined (0)

Echogenic foci: none (0)

ACR TI-RADS total points: 3.

ACR TI-RADS risk category: TR3 (3 points).

ACR TI-RADS recommendations:

**Given size (>/= 2.5 cm) and appearance, fine needle aspiration of
this mildly suspicious nodule should be considered based on TI-RADS
criteria.

_________________________________________________________
IMPRESSION: 1. Markedly enlarged, heterogeneous and multinodular thyroid gland
most consistent with multinodular goiter.
2. Numerous (at least 4) nodules technically meet criteria for
consideration of fine-needle aspiration biopsy. However, biopsy of
no more than 2 nodules is currently recommended by consensus
criteria. Consider biopsy of the largest/most suspicious nodules #5
and #6 above. The remaining nodules can be followed with imaging
surveillance and follow-up ultrasound in 1 year.

The above is in keeping with the ACR TI-RADS recommendations - [HOSPITAL] [id number];[DATE].

## 2022-07-11 DEATH — deceased
